# Patient Record
Sex: Male | Born: 1960 | Race: White | Hispanic: No | Marital: Married | Smoking: Former smoker
Health system: Southern US, Community
[De-identification: ages and names within clinical notes are randomized; demographics above are authoritative.]

## PROBLEM LIST (undated history)

## (undated) DIAGNOSIS — E079 Disorder of thyroid, unspecified: Secondary | ICD-10-CM

## (undated) DIAGNOSIS — I10 Essential (primary) hypertension: Secondary | ICD-10-CM

## (undated) DIAGNOSIS — M1990 Unspecified osteoarthritis, unspecified site: Secondary | ICD-10-CM

## (undated) DIAGNOSIS — I209 Angina pectoris, unspecified: Secondary | ICD-10-CM

## (undated) HISTORY — PX: FL INJ RT SHOULDER CT ARTHROGRAM (ARMC HX): HXRAD1304

## (undated) HISTORY — PX: OTHER SURGICAL HISTORY: SHX169

## (undated) HISTORY — PX: TIBIA FRACTURE SURGERY: SHX806

---

## 2004-11-19 ENCOUNTER — Emergency Department: Payer: Self-pay | Admitting: Unknown Physician Specialty

## 2005-06-13 ENCOUNTER — Emergency Department: Payer: Self-pay | Admitting: Emergency Medicine

## 2005-06-14 ENCOUNTER — Emergency Department: Payer: Self-pay | Admitting: Emergency Medicine

## 2006-01-15 ENCOUNTER — Other Ambulatory Visit: Payer: Self-pay

## 2006-01-15 ENCOUNTER — Emergency Department: Payer: Self-pay | Admitting: Emergency Medicine

## 2006-01-18 ENCOUNTER — Other Ambulatory Visit: Payer: Self-pay

## 2006-01-18 ENCOUNTER — Emergency Department: Payer: Self-pay | Admitting: Emergency Medicine

## 2006-01-29 ENCOUNTER — Emergency Department: Payer: Self-pay | Admitting: Emergency Medicine

## 2006-01-29 ENCOUNTER — Other Ambulatory Visit: Payer: Self-pay

## 2006-02-01 ENCOUNTER — Emergency Department: Payer: Self-pay | Admitting: General Practice

## 2006-05-18 ENCOUNTER — Emergency Department: Payer: Self-pay | Admitting: General Practice

## 2009-07-21 ENCOUNTER — Emergency Department: Payer: Self-pay | Admitting: Emergency Medicine

## 2009-08-14 ENCOUNTER — Ambulatory Visit: Payer: Self-pay | Admitting: Orthopedic Surgery

## 2009-08-26 ENCOUNTER — Encounter: Payer: Self-pay | Admitting: Orthopedic Surgery

## 2009-09-02 ENCOUNTER — Encounter: Payer: Self-pay | Admitting: Orthopedic Surgery

## 2009-09-21 ENCOUNTER — Ambulatory Visit: Payer: Self-pay | Admitting: Orthopedic Surgery

## 2009-10-03 ENCOUNTER — Encounter: Payer: Self-pay | Admitting: Orthopedic Surgery

## 2009-11-03 ENCOUNTER — Encounter: Payer: Self-pay | Admitting: Orthopedic Surgery

## 2009-12-01 ENCOUNTER — Encounter: Payer: Self-pay | Admitting: Orthopedic Surgery

## 2010-04-09 ENCOUNTER — Emergency Department: Payer: Self-pay | Admitting: Emergency Medicine

## 2010-04-12 ENCOUNTER — Emergency Department: Payer: Self-pay | Admitting: Emergency Medicine

## 2010-04-14 ENCOUNTER — Inpatient Hospital Stay: Payer: Self-pay | Admitting: Urology

## 2010-10-02 ENCOUNTER — Emergency Department: Payer: Self-pay | Admitting: Emergency Medicine

## 2011-02-09 ENCOUNTER — Emergency Department: Payer: Self-pay | Admitting: *Deleted
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
## 2011-08-26 ENCOUNTER — Emergency Department: Payer: Self-pay | Admitting: Emergency Medicine

## 2011-12-26 ENCOUNTER — Emergency Department: Payer: Self-pay | Admitting: Emergency Medicine

## 2011-12-26 LAB — CBC
HCT: 40.7 % (ref 40.0–52.0)
MCH: 31.3 pg (ref 26.0–34.0)
RBC: 4.59 10*6/uL (ref 4.40–5.90)
RDW: 12.9 % (ref 11.5–14.5)

## 2011-12-26 LAB — COMPREHENSIVE METABOLIC PANEL
Alkaline Phosphatase: 126 U/L (ref 50–136)
BUN: 17 mg/dL (ref 7–18)
Calcium, Total: 8.6 mg/dL (ref 8.5–10.1)
Co2: 25 mmol/L (ref 21–32)
Creatinine: 1.1 mg/dL (ref 0.60–1.30)
EGFR (African American): >60
Osmolality: 284 (ref 275–301)
SGOT(AST): 54 U/L — ABNORMAL HIGH (ref 15–37)
SGPT (ALT): 63 U/L
Total Protein: 7.1 g/dL (ref 6.4–8.2)

## 2011-12-26 LAB — TROPONIN I: Troponin-I: <0.02 ng/mL

## 2013-03-21 ENCOUNTER — Emergency Department: Payer: Self-pay | Admitting: Emergency Medicine

## 2013-03-22 ENCOUNTER — Emergency Department: Payer: Self-pay | Admitting: Emergency Medicine

## 2013-03-25 LAB — BODY FLUID CULTURE

## 2016-04-26 DIAGNOSIS — I208 Other forms of angina pectoris: Secondary | ICD-10-CM | POA: Diagnosis present

## 2016-05-04 ENCOUNTER — Encounter: Payer: Self-pay | Admitting: Certified Registered Nurse Anesthetist

## 2016-05-04 ENCOUNTER — Encounter
Admission: RE | Disposition: A | Discharge status: Home / Self Care | Payer: Self-pay | Source: Ambulatory Visit | Attending: Cardiology

## 2016-05-04 ENCOUNTER — Ambulatory Visit
Admission: RE | Admit: 2016-05-04 | Discharge: 2016-05-05 | Disposition: A | Discharge status: Home / Self Care | Payer: Medicaid Other | Source: Ambulatory Visit | Attending: Cardiology | Admitting: Cardiology

## 2016-05-04 ENCOUNTER — Encounter: Payer: Self-pay | Admitting: *Deleted

## 2016-05-04 DIAGNOSIS — Z9889 Other specified postprocedural states: Secondary | ICD-10-CM | POA: Insufficient documentation

## 2016-05-04 DIAGNOSIS — Z791 Long term (current) use of non-steroidal anti-inflammatories (NSAID): Secondary | ICD-10-CM | POA: Diagnosis not present

## 2016-05-04 DIAGNOSIS — Z7982 Long term (current) use of aspirin: Secondary | ICD-10-CM | POA: Diagnosis not present

## 2016-05-04 DIAGNOSIS — Z825 Family history of asthma and other chronic lower respiratory diseases: Secondary | ICD-10-CM | POA: Insufficient documentation

## 2016-05-04 DIAGNOSIS — Z8249 Family history of ischemic heart disease and other diseases of the circulatory system: Secondary | ICD-10-CM | POA: Diagnosis not present

## 2016-05-04 DIAGNOSIS — Z79899 Other long term (current) drug therapy: Secondary | ICD-10-CM | POA: Diagnosis not present

## 2016-05-04 DIAGNOSIS — Z79891 Long term (current) use of opiate analgesic: Secondary | ICD-10-CM | POA: Diagnosis not present

## 2016-05-04 DIAGNOSIS — R9430 Abnormal result of cardiovascular function study, unspecified: Secondary | ICD-10-CM | POA: Diagnosis present

## 2016-05-04 DIAGNOSIS — I208 Other forms of angina pectoris: Secondary | ICD-10-CM | POA: Diagnosis present

## 2016-05-04 DIAGNOSIS — I10 Essential (primary) hypertension: Secondary | ICD-10-CM | POA: Insufficient documentation

## 2016-05-04 DIAGNOSIS — E7800 Pure hypercholesterolemia, unspecified: Secondary | ICD-10-CM | POA: Insufficient documentation

## 2016-05-04 DIAGNOSIS — R079 Chest pain, unspecified: Secondary | ICD-10-CM | POA: Diagnosis present

## 2016-05-04 DIAGNOSIS — I25119 Atherosclerotic heart disease of native coronary artery with unspecified angina pectoris: Secondary | ICD-10-CM | POA: Diagnosis not present

## 2016-05-04 DIAGNOSIS — K219 Gastro-esophageal reflux disease without esophagitis: Secondary | ICD-10-CM | POA: Diagnosis not present

## 2016-05-04 DIAGNOSIS — Z87891 Personal history of nicotine dependence: Secondary | ICD-10-CM | POA: Insufficient documentation

## 2016-05-04 DIAGNOSIS — I2510 Atherosclerotic heart disease of native coronary artery without angina pectoris: Secondary | ICD-10-CM | POA: Diagnosis present

## 2016-05-04 DIAGNOSIS — I209 Angina pectoris, unspecified: Secondary | ICD-10-CM | POA: Diagnosis present

## 2016-05-04 DIAGNOSIS — E079 Disorder of thyroid, unspecified: Secondary | ICD-10-CM | POA: Diagnosis not present

## 2016-05-04 HISTORY — PX: CARDIAC CATHETERIZATION: SHX172

## 2016-05-04 HISTORY — DX: Angina pectoris, unspecified: I20.9

## 2016-05-04 HISTORY — DX: Unspecified osteoarthritis, unspecified site: M19.90

## 2016-05-04 HISTORY — DX: Essential (primary) hypertension: I10

## 2016-05-04 LAB — CREATININE, SERUM
CREATININE: 1.05 mg/dL (ref 0.61–1.24)
GFR calc Af Amer: >60 mL/min (ref >60)
GFR calc non Af Amer: >60 mL/min (ref >60)

## 2016-05-04 LAB — CBC
HCT: 38.8 % — ABNORMAL LOW (ref 40.0–52.0)
HEMOGLOBIN: 13.9 g/dL (ref 13.0–18.0)
MCH: 30.6 pg (ref 26.0–34.0)
MCHC: 35.8 g/dL (ref 32.0–36.0)
MCV: 85.5 fL (ref 80.0–100.0)
Platelets: 124 10*3/uL — ABNORMAL LOW (ref 150–440)
RBC: 4.54 MIL/uL (ref 4.40–5.90)
RDW: 12.9 % (ref 11.5–14.5)
WBC: 4.8 10*3/uL (ref 3.8–10.6)

## 2016-05-04 LAB — MRSA PCR SCREENING: MRSA by PCR: NEGATIVE

## 2016-05-04 SURGERY — LEFT HEART CATH AND CORONARY ANGIOGRAPHY
Anesthesia: Moderate Sedation

## 2016-05-04 MED ORDER — MIDAZOLAM HCL 2 MG/2ML IJ SOLN
INTRAMUSCULAR | Status: DC | PRN
  Administered 2016-05-04: 1 mg via INTRAVENOUS

## 2016-05-04 MED ORDER — SODIUM CHLORIDE 0.9 % WEIGHT BASED INFUSION: 1.0000 mL/kg/h | INTRAVENOUS | Status: DC

## 2016-05-04 MED ORDER — MORPHINE SULFATE (PF) 4 MG/ML IV SOLN
INTRAVENOUS | Status: DC | PRN
  Administered 2016-05-04 (×2): 2 mg via INTRAVENOUS

## 2016-05-04 MED ORDER — SODIUM CHLORIDE 0.9% FLUSH: 3.0000 mL | INTRAVENOUS | Status: DC | PRN

## 2016-05-04 MED ORDER — ASPIRIN EC 325 MG PO TBEC
325.0000 mg | DELAYED_RELEASE_TABLET | Freq: Every day | ORAL | Status: DC
  Administered 2016-05-05: 325 mg via ORAL
  Filled 2016-05-04: qty 1

## 2016-05-04 MED ORDER — SODIUM CHLORIDE 0.9 % WEIGHT BASED INFUSION: 3.0000 mL/kg/h | INTRAVENOUS | Status: DC

## 2016-05-04 MED ORDER — FENTANYL CITRATE (PF) 100 MCG/2ML IJ SOLN
INTRAMUSCULAR | Status: AC
  Filled 2016-05-04: qty 2

## 2016-05-04 MED ORDER — CLOPIDOGREL BISULFATE 75 MG PO TABS
75.0000 mg | ORAL_TABLET | Freq: Every day | ORAL | Status: DC
  Administered 2016-05-05: 75 mg via ORAL
  Filled 2016-05-04: qty 1

## 2016-05-04 MED ORDER — IOPAMIDOL (ISOVUE-300) INJECTION 61%
INTRAVENOUS | Status: DC | PRN
  Administered 2016-05-04: 150 mL via INTRA_ARTERIAL

## 2016-05-04 MED ORDER — FENTANYL CITRATE (PF) 100 MCG/2ML IJ SOLN
INTRAMUSCULAR | Status: DC | PRN
  Administered 2016-05-04: 25 ug via INTRAVENOUS

## 2016-05-04 MED ORDER — MORPHINE SULFATE (PF) 2 MG/ML IV SOLN: 2.0000 mg | INTRAVENOUS | Status: DC | PRN

## 2016-05-04 MED ORDER — METOPROLOL TARTRATE 25 MG PO TABS
25.0000 mg | ORAL_TABLET | Freq: Two times a day (BID) | ORAL | Status: DC
  Filled 2016-05-04 (×2): qty 1

## 2016-05-04 MED ORDER — MIDAZOLAM HCL 2 MG/2ML IJ SOLN
INTRAMUSCULAR | Status: DC | PRN
  Administered 2016-05-04 (×3): 1 mg via INTRAVENOUS

## 2016-05-04 MED ORDER — ASPIRIN 81 MG PO CHEW
CHEWABLE_TABLET | ORAL | Status: AC
  Filled 2016-05-04: qty 4

## 2016-05-04 MED ORDER — MIDAZOLAM HCL 2 MG/2ML IJ SOLN
INTRAMUSCULAR | Status: AC
  Filled 2016-05-04: qty 2

## 2016-05-04 MED ORDER — FENTANYL CITRATE (PF) 100 MCG/2ML IJ SOLN
INTRAMUSCULAR | Status: DC | PRN
  Administered 2016-05-04 (×3): 25 ug via INTRAVENOUS
  Administered 2016-05-04: 50 ug via INTRAVENOUS

## 2016-05-04 MED ORDER — MORPHINE SULFATE (PF) 4 MG/ML IV SOLN
INTRAVENOUS | Status: AC
  Filled 2016-05-04: qty 1

## 2016-05-04 MED ORDER — MORPHINE SULFATE ER 30 MG PO TBCR
30.0000 mg | EXTENDED_RELEASE_TABLET | Freq: Two times a day (BID) | ORAL | Status: DC
  Administered 2016-05-04 – 2016-05-05 (×2): 30 mg via ORAL
  Filled 2016-05-04 (×2): qty 1

## 2016-05-04 MED ORDER — SODIUM CHLORIDE 0.9 % IV SOLN
INTRAVENOUS | Status: DC | PRN
  Administered 2016-05-04: 250 mL via INTRAVENOUS

## 2016-05-04 MED ORDER — SODIUM CHLORIDE 0.9 % IV SOLN
INTRAVENOUS | Status: DC | PRN
  Administered 2016-05-04: 1.75 mg/kg/h via INTRAVENOUS

## 2016-05-04 MED ORDER — NITROGLYCERIN 5 MG/ML IV SOLN
INTRAVENOUS | Status: AC
  Filled 2016-05-04: qty 10

## 2016-05-04 MED ORDER — ONDANSETRON HCL 4 MG/2ML IJ SOLN: 4.0000 mg | Freq: Four times a day (QID) | INTRAMUSCULAR | Status: DC | PRN

## 2016-05-04 MED ORDER — CLOPIDOGREL BISULFATE 75 MG PO TABS
ORAL_TABLET | ORAL | Status: AC
  Filled 2016-05-04: qty 8

## 2016-05-04 MED ORDER — ISOSORBIDE MONONITRATE ER 30 MG PO TB24
30.0000 mg | ORAL_TABLET | Freq: Every day | ORAL | Status: DC
  Filled 2016-05-04: qty 1

## 2016-05-04 MED ORDER — ASPIRIN 81 MG PO CHEW: 81.0000 mg | CHEWABLE_TABLET | ORAL | Status: DC

## 2016-05-04 MED ORDER — OXYCODONE-ACETAMINOPHEN 5-325 MG PO TABS
ORAL_TABLET | ORAL | Status: AC
  Administered 2016-05-04: 2 via ORAL
  Filled 2016-05-04: qty 2

## 2016-05-04 MED ORDER — SODIUM CHLORIDE 0.9 % IV SOLN: 250.0000 mL | INTRAVENOUS | Status: DC | PRN

## 2016-05-04 MED ORDER — CLOPIDOGREL BISULFATE 75 MG PO TABS
ORAL_TABLET | ORAL | Status: DC | PRN
  Administered 2016-05-04: 600 mg via ORAL

## 2016-05-04 MED ORDER — SODIUM CHLORIDE 0.9 % WEIGHT BASED INFUSION: 3.0000 mL/kg/h | INTRAVENOUS | Status: AC

## 2016-05-04 MED ORDER — ASPIRIN 81 MG PO CHEW
CHEWABLE_TABLET | ORAL | Status: DC | PRN
  Administered 2016-05-04: 324 mg via ORAL

## 2016-05-04 MED ORDER — SODIUM CHLORIDE 0.9% FLUSH
3.0000 mL | Freq: Two times a day (BID) | INTRAVENOUS | Status: DC
  Administered 2016-05-04 – 2016-05-05 (×3): 3 mL via INTRAVENOUS

## 2016-05-04 MED ORDER — IOPAMIDOL (ISOVUE-300) INJECTION 61%
INTRAVENOUS | Status: DC | PRN
  Administered 2016-05-04: 130 mg via INTRA_ARTERIAL

## 2016-05-04 MED ORDER — HEPARIN SODIUM (PORCINE) 5000 UNIT/ML IJ SOLN
5000.0000 [IU] | Freq: Three times a day (TID) | INTRAMUSCULAR | Status: DC
  Filled 2016-05-04: qty 1

## 2016-05-04 MED ORDER — LEVOTHYROXINE SODIUM 75 MCG PO TABS
75.0000 ug | ORAL_TABLET | Freq: Every day | ORAL | Status: DC
  Administered 2016-05-05: 75 ug via ORAL
  Filled 2016-05-04 (×2): qty 1

## 2016-05-04 MED ORDER — SODIUM CHLORIDE 0.9% FLUSH: 3.0000 mL | Freq: Two times a day (BID) | INTRAVENOUS | Status: DC

## 2016-05-04 MED ORDER — BIVALIRUDIN 250 MG IV SOLR
INTRAVENOUS | Status: AC
  Filled 2016-05-04: qty 250

## 2016-05-04 MED ORDER — HEPARIN (PORCINE) IN NACL 2-0.9 UNIT/ML-% IJ SOLN
INTRAMUSCULAR | Status: AC
  Filled 2016-05-04: qty 500

## 2016-05-04 MED ORDER — ACETAMINOPHEN 325 MG PO TABS: 650.0000 mg | ORAL_TABLET | ORAL | Status: DC | PRN

## 2016-05-04 MED ORDER — MELOXICAM 7.5 MG PO TABS
15.0000 mg | ORAL_TABLET | Freq: Every day | ORAL | Status: DC
  Administered 2016-05-05: 15 mg via ORAL
  Filled 2016-05-04 (×2): qty 2
  Filled 2016-05-04: qty 1

## 2016-05-04 MED ORDER — OXYCODONE-ACETAMINOPHEN 5-325 MG PO TABS
1.0000 | ORAL_TABLET | ORAL | Status: DC | PRN
Start: 2016-05-04 — End: 2016-05-05
  Administered 2016-05-04 – 2016-05-05 (×5): 2 via ORAL
  Filled 2016-05-04 (×4): qty 2

## 2016-05-04 MED ORDER — BIVALIRUDIN BOLUS VIA INFUSION - CUPID
INTRAVENOUS | Status: DC | PRN
  Administered 2016-05-04: 66.375 mg via INTRAVENOUS

## 2016-05-04 SURGICAL SUPPLY — 16 items
BALLN TREK RX 2.5X15 (BALLOONS) ×4
BALLOON TREK RX 2.5X15 (BALLOONS) ×2 IMPLANT
CATH INFINITI 5FR ANG PIGTAIL (CATHETERS) ×4 IMPLANT
CATH INFINITI 5FR JL4 (CATHETERS) ×4 IMPLANT
CATH INFINITI JR4 5F (CATHETERS) ×4 IMPLANT
CATH VISTA GUIDE 6FR XB3.5 (CATHETERS) ×4 IMPLANT
DEVICE CLOSURE MYNXGRIP 6/7F (Vascular Products) ×4 IMPLANT
DEVICE INFLAT 30 PLUS (MISCELLANEOUS) ×4 IMPLANT
KIT MANI 3VAL PERCEP (MISCELLANEOUS) ×4 IMPLANT
NEEDLE PERC 18GX7CM (NEEDLE) ×4 IMPLANT
PACK CARDIAC CATH (CUSTOM PROCEDURE TRAY) ×4 IMPLANT
SHEATH AVANTI 6FR X 11CM (SHEATH) ×4 IMPLANT
SHEATH PINNACLE 5F 10CM (SHEATH) ×4 IMPLANT
STENT XIENCE ALPINE RX 3.25X23 (Permanent Stent) ×4 IMPLANT
WIRE ASAHI PROWATER 180CM (WIRE) ×4 IMPLANT
WIRE EMERALD 3MM-J .035X150CM (WIRE) ×4 IMPLANT

## 2016-05-04 NOTE — Progress Notes (Signed)
Spoke with sara wall regarding precaution. Patient has history of mrsa. mrsa pca swab was done and came back negative. Per sara wall contact precautions can be discontinued

## 2016-05-04 NOTE — Progress Notes (Signed)
Patient has chronic right shoulder pain. C/O pain in that area. Given PO pain medication. Patient is resting quietly now.

## 2016-05-04 NOTE — Progress Notes (Signed)
Patient transported to floor by bed with RN. 2A nurse Crystal in room and inspected right groin with this RN.

## 2016-05-04 NOTE — Progress Notes (Signed)
Report called to [employer], RN on 2A. Patient assigned to room 260. Patient should be ready to transport around 1115.

## 2016-05-04 NOTE — Care Management (Signed)
Elective cardiac cath resulting in cardiac intervention.  At present is not on cost prohibitive anti platelet or cost prohibitive anticoagulant

## 2016-05-04 NOTE — Discharge Instructions (Signed)

## 2016-05-05 ENCOUNTER — Encounter: Payer: Self-pay | Admitting: Internal Medicine

## 2016-05-05 DIAGNOSIS — I25119 Atherosclerotic heart disease of native coronary artery with unspecified angina pectoris: Secondary | ICD-10-CM | POA: Diagnosis not present

## 2016-05-05 LAB — CBC
HEMATOCRIT: 40 % (ref 40.0–52.0)
Hemoglobin: 14.2 g/dL (ref 13.0–18.0)
MCH: 30.5 pg (ref 26.0–34.0)
MCHC: 35.5 g/dL (ref 32.0–36.0)
MCV: 85.8 fL (ref 80.0–100.0)
PLATELETS: 122 10*3/uL — AB (ref 150–440)
RBC: 4.66 MIL/uL (ref 4.40–5.90)
RDW: 12.9 % (ref 11.5–14.5)
WBC: 5.4 10*3/uL (ref 3.8–10.6)

## 2016-05-05 LAB — BASIC METABOLIC PANEL
Anion gap: 6 (ref 5–15)
BUN: 19 mg/dL (ref 6–20)
CHLORIDE: 106 mmol/L (ref 101–111)
CO2: 26 mmol/L (ref 22–32)
CREATININE: 0.95 mg/dL (ref 0.61–1.24)
Calcium: 8.9 mg/dL (ref 8.9–10.3)
GFR calc Af Amer: >60 mL/min (ref >60)
GFR calc non Af Amer: >60 mL/min (ref >60)
GLUCOSE: 104 mg/dL — AB (ref 65–99)
POTASSIUM: 4.2 mmol/L (ref 3.5–5.1)
SODIUM: 138 mmol/L (ref 135–145)

## 2016-05-05 MED ORDER — ATORVASTATIN CALCIUM 80 MG PO TABS: 80.0000 mg | ORAL_TABLET | Freq: Every day | ORAL | 4 refills | Status: AC

## 2016-05-05 MED ORDER — CLOPIDOGREL BISULFATE 75 MG PO TABS: 75.0000 mg | ORAL_TABLET | Freq: Every day | ORAL | 4 refills | Status: AC

## 2016-05-05 MED ORDER — ASPIRIN 325 MG PO TBEC: 325.0000 mg | DELAYED_RELEASE_TABLET | Freq: Every day | ORAL | 0 refills | Status: AC

## 2016-05-05 NOTE — Care Management (Signed)
Is not discharging home on cost prohibitive antiplatelet medication.  there are no discharge needs.

## 2016-05-05 NOTE — Progress Notes (Signed)
Discharge instructions along with home medication list and follow up gone over with patient. Patient made aware prescriptions were electronically submitted to pharmacy. Patient verbalized that he understood instructions. Right groin site clean and dry. No bleeding no hematoma noted, soft to touch. Patient waiting on ride, will remove iv and telemetry once ride is available.

## 2016-05-05 NOTE — Discharge Summary (Signed)
[facility] Cardiology Discharge Summary  Patient ID: JACQUISE HALPIN MRN: [id number] DOB/AGE: 1961/04/24 55 y.o.  Admit date: 05/04/2016 Discharge date: 05/05/2016  Primary Discharge Diagnosis: Ischemic chest pain I20.9 Secondary Discharge Diagnosis high cholesterol and smoking  Significant Diagnostic Studies: Cardiac cath with left ventricular angiogram and selective coronary injection as well as PCI and stent placement of left anterior descending.  Hospital Course: The patient was admitted to specials for cardiac cath with selective coronary angiogram after full consent, risk and benefits explained, and time out called with all approprate details voiced and discussed. The patient has had progressive canadian class 4 angina with high probability risk stress test consistent with ischemic chest pain and or anginal equivalent with coronary artery risk factors including high blood pressure, high cholesterol and smoking. The procedure was performed without complication and it revealed normal left ventricular function with ejection fraction of 60%.  It was found that the patient had severe 1 vessel coronary atherosclerosis with significant left anterior descending stenosis requiring further intervention. Therefore, the patient had a PCI and drug eluding stent placed without complication. The patient has been ambulating without further significant symptoms and has reached his maximal hospital benefit and will be discharged to home in good condition.  Cardiac rehabilitation has been discussed and recommended. Medication management of cardiovascular risk factors will be given post discharge and modified as an outpatient.   Discharge Exam: Blood pressure 129/73, pulse (!) 50, temperature 98.2 F (36.8 C), temperature source Oral, resp. rate 20, height 5\' 9"  (1.753 m), weight 195 lb (88.5 kg), SpO2 96 %.  Constitutional: Alet oriented to person, place, and time. No distress.  HENT: No nasal  discharge.  Head: Normocephalic and atraumatic.  Eyes: Pupils are equal and round. No discharge.  Neck: Normal range of motion. Neck supple. No JVD present. No thyromegaly present.  Cardiovascular: Normal rate, regular rhythm, normal S1 S2, no gallop, no friction rub. No murmur Pulmonary/Chest: Effort normal, No stridor. No respiratory distress. no wheezes.  no rales.    Abdominal: Soft. Bowel sounds are normal.  no distension.  no tenderness. There is no rebound and no guarding.  Musculoskeletal: No edema, no cyanosis, normal pulses, no bleeding, Normal range of motion. no tenderness.  Neurological:  alert and oriented to person, place, and time. Coordination normal.  Skin: Skin is warm and dry. No rash noted. No erythema. No pallor.  Psychiatric:  normal mood and affect. behavior is normal.    Labs:   Lab Results  Component Value Date   WBC 5.4 05/05/2016   HGB 14.2 05/05/2016   HCT 40.0 05/05/2016   MCV 85.8 05/05/2016   PLT 122 (L) 05/05/2016    Recent Labs Lab 05/05/16 0355  NA 138  K 4.2  CL 106  CO2 26  BUN 19  CREATININE 0.95  CALCIUM 8.9  GLUCOSE 104*    EKG: NSR without evidence of new changes  FOLLOW UP IN ONE TO TWO WEEKS    Medication List    STOP taking these medications   ibuprofen 200 MG tablet Commonly known as:  ADVIL,MOTRIN   meloxicam 15 MG tablet Commonly known as:  MOBIC   morphine 30 MG 12 hr tablet Commonly known as:  MS CONTIN   nitroGLYCERIN 0.4 MG SL tablet Commonly known as:  NITROSTAT   Oxycodone HCl 20 MG Tabs     TAKE these medications   aspirin 325 MG EC tablet Take 1 tablet (325 mg total) by mouth daily.  atorvastatin 80 MG tablet Commonly known as:  LIPITOR Take 1 tablet (80 mg total) by mouth daily.   clopidogrel 75 MG tablet Commonly known as:  PLAVIX Take 1 tablet (75 mg total) by mouth daily with breakfast.   levothyroxine 75 MCG tablet Commonly known as:  SYNTHROID, LEVOTHROID Take 75 mcg by mouth daily  before breakfast. What changed:  Another medication with the same name was removed. Continue taking this medication, and follow the directions you see here.   metoprolol tartrate 25 MG tablet Commonly known as:  LOPRESSOR Take 25 mg by mouth 2 (two) times daily.        THE PATIENT  SHALL BRING ALL MEDICATIONS TO FOLLOW UP APPOINTMENT  Signed:  Lamar Blinks MD, [facility] 05/05/2016, 9:11 AM

## 2018-04-19 ENCOUNTER — Other Ambulatory Visit: Payer: Self-pay

## 2018-04-19 ENCOUNTER — Encounter: Payer: Self-pay | Admitting: Emergency Medicine

## 2018-04-19 ENCOUNTER — Emergency Department
Admission: EM | Admit: 2018-04-19 | Discharge: 2018-04-19 | Disposition: A | Discharge status: Home / Self Care | Payer: Medicaid Other | Attending: Emergency Medicine | Admitting: Emergency Medicine

## 2018-04-19 DIAGNOSIS — Z87891 Personal history of nicotine dependence: Secondary | ICD-10-CM | POA: Diagnosis not present

## 2018-04-19 DIAGNOSIS — I259 Chronic ischemic heart disease, unspecified: Secondary | ICD-10-CM | POA: Insufficient documentation

## 2018-04-19 DIAGNOSIS — I10 Essential (primary) hypertension: Secondary | ICD-10-CM | POA: Insufficient documentation

## 2018-04-19 DIAGNOSIS — E860 Dehydration: Secondary | ICD-10-CM | POA: Diagnosis present

## 2018-04-19 DIAGNOSIS — X30XXXA Exposure to excessive natural heat, initial encounter: Secondary | ICD-10-CM | POA: Insufficient documentation

## 2018-04-19 DIAGNOSIS — N289 Disorder of kidney and ureter, unspecified: Secondary | ICD-10-CM | POA: Diagnosis not present

## 2018-04-19 HISTORY — DX: Disorder of thyroid, unspecified: E07.9

## 2018-04-19 LAB — BASIC METABOLIC PANEL
Anion gap: 8 (ref 5–15)
BUN: 24 mg/dL — ABNORMAL HIGH (ref 6–20)
CALCIUM: 8.5 mg/dL — AB (ref 8.9–10.3)
CHLORIDE: 106 mmol/L (ref 98–111)
CO2: 24 mmol/L (ref 22–32)
Creatinine, Ser: 1.75 mg/dL — ABNORMAL HIGH (ref 0.61–1.24)
GFR calc non Af Amer: 41 mL/min — ABNORMAL LOW (ref >60)
GFR, EST AFRICAN AMERICAN: 48 mL/min — AB (ref >60)
GLUCOSE: 76 mg/dL (ref 70–99)
Potassium: 4.2 mmol/L (ref 3.5–5.1)
Sodium: 138 mmol/L (ref 135–145)

## 2018-04-19 LAB — URINALYSIS, COMPLETE (UACMP) WITH MICROSCOPIC
Bilirubin Urine: NEGATIVE
GLUCOSE, UA: NEGATIVE mg/dL
HGB URINE DIPSTICK: NEGATIVE
KETONES UR: NEGATIVE mg/dL
LEUKOCYTES UA: NEGATIVE
NITRITE: NEGATIVE
PH: 5 (ref 5.0–8.0)
PROTEIN: 30 mg/dL — AB
Specific Gravity, Urine: 1.025 (ref 1.005–1.030)

## 2018-04-19 LAB — CBC WITH DIFFERENTIAL/PLATELET
Basophils Absolute: 0 10*3/uL (ref 0–0.1)
Basophils Relative: 0 %
EOS ABS: 0.1 10*3/uL (ref 0–0.7)
EOS PCT: 1 %
HCT: 37 % — ABNORMAL LOW (ref 40.0–52.0)
Hemoglobin: 13 g/dL (ref 13.0–18.0)
LYMPHS ABS: 1 10*3/uL (ref 1.0–3.6)
Lymphocytes Relative: 14 %
MCH: 30.9 pg (ref 26.0–34.0)
MCHC: 35.3 g/dL (ref 32.0–36.0)
MCV: 87.7 fL (ref 80.0–100.0)
MONO ABS: 0.4 10*3/uL (ref 0.2–1.0)
MONOS PCT: 6 %
Neutro Abs: 5.6 10*3/uL (ref 1.4–6.5)
Neutrophils Relative %: 79 %
PLATELETS: 144 10*3/uL — AB (ref 150–440)
RBC: 4.22 MIL/uL — AB (ref 4.40–5.90)
RDW: 12.9 % (ref 11.5–14.5)
WBC: 7.1 10*3/uL (ref 3.8–10.6)

## 2018-04-19 LAB — COMPREHENSIVE METABOLIC PANEL
ALBUMIN: 4 g/dL (ref 3.5–5.0)
ALK PHOS: 83 U/L (ref 38–126)
ALT: 21 U/L (ref 0–44)
ANION GAP: 10 (ref 5–15)
AST: 32 U/L (ref 15–41)
BUN: 25 mg/dL — ABNORMAL HIGH (ref 6–20)
CHLORIDE: 105 mmol/L (ref 98–111)
CO2: 23 mmol/L (ref 22–32)
Calcium: 8.7 mg/dL — ABNORMAL LOW (ref 8.9–10.3)
Creatinine, Ser: 2.22 mg/dL — ABNORMAL HIGH (ref 0.61–1.24)
GFR calc non Af Amer: 31 mL/min — ABNORMAL LOW (ref >60)
GFR, EST AFRICAN AMERICAN: 36 mL/min — AB (ref >60)
GLUCOSE: 126 mg/dL — AB (ref 70–99)
POTASSIUM: 3.5 mmol/L (ref 3.5–5.1)
SODIUM: 138 mmol/L (ref 135–145)
Total Bilirubin: 1.1 mg/dL (ref 0.3–1.2)
Total Protein: 7.1 g/dL (ref 6.5–8.1)

## 2018-04-19 LAB — TROPONIN I: Troponin I: <0.03 ng/mL (ref <0.03)

## 2018-04-19 LAB — CK: Total CK: 314 U/L (ref 49–397)

## 2018-04-19 MED ORDER — SODIUM CHLORIDE 0.9 % IV SOLN
1000.0000 mL | Freq: Once | INTRAVENOUS | Status: AC
  Administered 2018-04-19: 1000 mL via INTRAVENOUS

## 2018-04-19 MED ORDER — SODIUM CHLORIDE 0.9 % IV SOLN
Freq: Once | INTRAVENOUS | Status: AC
  Administered 2018-04-19: 19:00:00 via INTRAVENOUS
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
## 2021-03-17 ENCOUNTER — Emergency Department
Admission: EM | Admit: 2021-03-17 | Discharge: 2021-03-17 | Disposition: A | Discharge status: Home / Self Care | Payer: Medicaid Other | Attending: Emergency Medicine | Admitting: Emergency Medicine

## 2021-03-17 DIAGNOSIS — X30XXXA Exposure to excessive natural heat, initial encounter: Secondary | ICD-10-CM | POA: Diagnosis not present

## 2021-03-17 DIAGNOSIS — Z5321 Procedure and treatment not carried out due to patient leaving prior to being seen by health care provider: Secondary | ICD-10-CM | POA: Insufficient documentation

## 2021-03-17 DIAGNOSIS — T675XXA Heat exhaustion, unspecified, initial encounter: Secondary | ICD-10-CM | POA: Diagnosis not present

## 2021-03-17 NOTE — ED Triage Notes (Signed)
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 Pt had been working out side today and became over heated, cramping all over , not eaten today.   97 162/99 99%RA [id number] NS LR given PTA

## 2021-03-17 NOTE — ED Notes (Signed)
IV removed.

## 2021-03-17 NOTE — ED Notes (Signed)
Pt states he is feeling better since having fluids and wants to go home . Pt has family here to transport him home

## 2022-01-19 ENCOUNTER — Observation Stay
Admission: EM | Admit: 2022-01-19 | Discharge: 2022-01-20 | Discharge status: Against Medical Advice | Payer: Medicaid Other | Attending: Internal Medicine | Admitting: Internal Medicine

## 2022-01-19 ENCOUNTER — Observation Stay: Payer: Medicaid Other

## 2022-01-19 ENCOUNTER — Emergency Department: Payer: Medicaid Other

## 2022-01-19 ENCOUNTER — Other Ambulatory Visit: Payer: Self-pay

## 2022-01-19 ENCOUNTER — Encounter: Payer: Self-pay | Admitting: Emergency Medicine

## 2022-01-19 DIAGNOSIS — I10 Essential (primary) hypertension: Secondary | ICD-10-CM | POA: Diagnosis not present

## 2022-01-19 DIAGNOSIS — Z7982 Long term (current) use of aspirin: Secondary | ICD-10-CM | POA: Insufficient documentation

## 2022-01-19 DIAGNOSIS — G4700 Insomnia, unspecified: Secondary | ICD-10-CM | POA: Diagnosis present

## 2022-01-19 DIAGNOSIS — G3183 Dementia with Lewy bodies: Secondary | ICD-10-CM | POA: Insufficient documentation

## 2022-01-19 DIAGNOSIS — Z87891 Personal history of nicotine dependence: Secondary | ICD-10-CM | POA: Diagnosis not present

## 2022-01-19 DIAGNOSIS — R55 Syncope and collapse: Secondary | ICD-10-CM | POA: Diagnosis present

## 2022-01-19 DIAGNOSIS — R197 Diarrhea, unspecified: Secondary | ICD-10-CM | POA: Diagnosis not present

## 2022-01-19 DIAGNOSIS — Z79899 Other long term (current) drug therapy: Secondary | ICD-10-CM | POA: Insufficient documentation

## 2022-01-19 DIAGNOSIS — E039 Hypothyroidism, unspecified: Secondary | ICD-10-CM | POA: Diagnosis present

## 2022-01-19 DIAGNOSIS — I2510 Atherosclerotic heart disease of native coronary artery without angina pectoris: Secondary | ICD-10-CM | POA: Diagnosis not present

## 2022-01-19 DIAGNOSIS — Z20822 Contact with and (suspected) exposure to covid-19: Secondary | ICD-10-CM | POA: Insufficient documentation

## 2022-01-19 DIAGNOSIS — F0280 Dementia in other diseases classified elsewhere without behavioral disturbance: Secondary | ICD-10-CM | POA: Diagnosis present

## 2022-01-19 LAB — HEPATIC FUNCTION PANEL
ALT: 33 U/L (ref 0–44)
AST: 44 U/L — ABNORMAL HIGH (ref 15–41)
Albumin: 4.3 g/dL (ref 3.5–5.0)
Alkaline Phosphatase: 84 U/L (ref 38–126)
Bilirubin, Direct: 0.2 mg/dL (ref 0.0–0.2)
Indirect Bilirubin: 0.9 mg/dL (ref 0.3–0.9)
Total Bilirubin: 1.1 mg/dL (ref 0.3–1.2)
Total Protein: 8.2 g/dL — ABNORMAL HIGH (ref 6.5–8.1)

## 2022-01-19 LAB — URINALYSIS, ROUTINE W REFLEX MICROSCOPIC
Bacteria, UA: NONE SEEN
Bilirubin Urine: NEGATIVE
Glucose, UA: NEGATIVE mg/dL
Ketones, ur: NEGATIVE mg/dL
Leukocytes,Ua: NEGATIVE
Nitrite: NEGATIVE
Protein, ur: NEGATIVE mg/dL
Specific Gravity, Urine: 1.023 (ref 1.005–1.030)
Squamous Epithelial / LPF: NONE SEEN (ref 0–5)
pH: 7 (ref 5.0–8.0)

## 2022-01-19 LAB — BASIC METABOLIC PANEL
Anion gap: 8 (ref 5–15)
BUN: 20 mg/dL (ref 8–23)
CO2: 25 mmol/L (ref 22–32)
Calcium: 9.6 mg/dL (ref 8.9–10.3)
Chloride: 105 mmol/L (ref 98–111)
Creatinine, Ser: 0.99 mg/dL (ref 0.61–1.24)
GFR, Estimated: >60 mL/min (ref >60)
Glucose, Bld: 132 mg/dL — ABNORMAL HIGH (ref 70–99)
Potassium: 4.1 mmol/L (ref 3.5–5.1)
Sodium: 138 mmol/L (ref 135–145)

## 2022-01-19 LAB — CBC
HCT: 43.1 % (ref 39.0–52.0)
Hemoglobin: 14.4 g/dL (ref 13.0–17.0)
MCH: 28.9 pg (ref 26.0–34.0)
MCHC: 33.4 g/dL (ref 30.0–36.0)
MCV: 86.4 fL (ref 80.0–100.0)
Platelets: 173 10*3/uL (ref 150–400)
RBC: 4.99 MIL/uL (ref 4.22–5.81)
RDW: 12.4 % (ref 11.5–15.5)
WBC: 5.9 10*3/uL (ref 4.0–10.5)
nRBC: 0 % (ref 0.0–0.2)

## 2022-01-19 LAB — BLOOD GAS, VENOUS
Acid-Base Excess: 2.5 mmol/L — ABNORMAL HIGH (ref 0.0–2.0)
Bicarbonate: 27.9 mmol/L (ref 20.0–28.0)
O2 Saturation: 94.7 %
Patient temperature: 37
pCO2, Ven: 45 mmHg (ref 44–60)
pH, Ven: 7.4 (ref 7.25–7.43)
pO2, Ven: 69 mmHg — ABNORMAL HIGH (ref 32–45)

## 2022-01-19 LAB — RESP PANEL BY RT-PCR (FLU A&B, COVID) ARPGX2
Influenza A by PCR: NEGATIVE
Influenza B by PCR: NEGATIVE
SARS Coronavirus 2 by RT PCR: NEGATIVE

## 2022-01-19 LAB — TROPONIN I (HIGH SENSITIVITY)
Troponin I (High Sensitivity): 5 ng/L (ref <18)
Troponin I (High Sensitivity): 5 ng/L (ref <18)

## 2022-01-19 LAB — TSH: TSH: 0.756 u[IU]/mL (ref 0.350–4.500)

## 2022-01-19 LAB — D-DIMER, QUANTITATIVE: D-Dimer, Quant: 0.72 ug/mL-FEU — ABNORMAL HIGH (ref 0.00–0.50)

## 2022-01-19 LAB — PROCALCITONIN: Procalcitonin: <0.1 ng/mL

## 2022-01-19 MED ORDER — TRAZODONE HCL 50 MG PO TABS: 50.0000 mg | ORAL_TABLET | Freq: Every evening | ORAL | Status: DC | PRN

## 2022-01-19 MED ORDER — ONDANSETRON HCL 4 MG PO TABS: 4.0000 mg | ORAL_TABLET | Freq: Four times a day (QID) | ORAL | Status: DC | PRN

## 2022-01-19 MED ORDER — ASPIRIN EC 325 MG PO TBEC: 325.0000 mg | DELAYED_RELEASE_TABLET | Freq: Every day | ORAL | Status: DC

## 2022-01-19 MED ORDER — ONDANSETRON HCL 4 MG/2ML IJ SOLN: 4.0000 mg | Freq: Four times a day (QID) | INTRAMUSCULAR | Status: DC | PRN

## 2022-01-19 MED ORDER — METOPROLOL TARTRATE 25 MG PO TABS: 25.0000 mg | ORAL_TABLET | Freq: Two times a day (BID) | ORAL | Status: DC

## 2022-01-19 MED ORDER — CLOPIDOGREL BISULFATE 75 MG PO TABS: 75.0000 mg | ORAL_TABLET | Freq: Every day | ORAL | Status: DC

## 2022-01-19 MED ORDER — MELATONIN 5 MG PO TABS
5.0000 mg | ORAL_TABLET | Freq: Every day | ORAL | Status: DC
  Administered 2022-01-19: 5 mg via ORAL
  Filled 2022-01-19: qty 1

## 2022-01-19 MED ORDER — POLYETHYLENE GLYCOL 3350 17 G PO PACK: 17.0000 g | PACK | Freq: Every day | ORAL | Status: DC | PRN

## 2022-01-19 MED ORDER — ENOXAPARIN SODIUM 40 MG/0.4ML IJ SOSY: 40.0000 mg | PREFILLED_SYRINGE | Freq: Every day | INTRAMUSCULAR | Status: DC

## 2022-01-19 MED ORDER — LORAZEPAM 2 MG/ML IJ SOLN
1.0000 mg | Freq: Once | INTRAMUSCULAR | Status: AC
Start: 2022-01-19 — End: 2022-01-19
  Administered 2022-01-19: 1 mg via INTRAVENOUS
  Filled 2022-01-19: qty 1

## 2022-01-19 MED ORDER — ACETAMINOPHEN 650 MG RE SUPP: 650.0000 mg | Freq: Four times a day (QID) | RECTAL | Status: DC | PRN

## 2022-01-19 MED ORDER — SODIUM CHLORIDE 0.9% FLUSH
3.0000 mL | Freq: Two times a day (BID) | INTRAVENOUS | Status: DC
  Administered 2022-01-19: 3 mL via INTRAVENOUS

## 2022-01-19 MED ORDER — LEVOTHYROXINE SODIUM 50 MCG PO TABS: 75.0000 ug | ORAL_TABLET | Freq: Every day | ORAL | Status: DC

## 2022-01-19 MED ORDER — IOHEXOL 350 MG/ML SOLN
100.0000 mL | Freq: Once | INTRAVENOUS | Status: AC | PRN
  Administered 2022-01-19: 100 mL via INTRAVENOUS

## 2022-01-19 MED ORDER — ATORVASTATIN CALCIUM 20 MG PO TABS: 80.0000 mg | ORAL_TABLET | Freq: Every day | ORAL | Status: DC

## 2022-01-19 MED ORDER — ACETAMINOPHEN 325 MG PO TABS
650.0000 mg | ORAL_TABLET | Freq: Four times a day (QID) | ORAL | Status: DC | PRN
Start: 2022-01-19 — End: 2022-01-20

## 2022-01-19 NOTE — ED Notes (Addendum)
Dr. Cox at the bedside 

## 2022-01-19 NOTE — Assessment & Plan Note (Addendum)
-   Etiology work-up in progress ?- Initial high sensitive troponin was reassuring for negative for ischemia/ACS ?- Check TSH given patient has dementia, possibility of noncompliance cannot be excluded at this time ?- Check B12, complete echo, D-dimer ?- If D-dimer is elevated we will order bilateral lower extremity ultrasound to assess for DVT and CTA of the chest to assess for PE ?- If the above work-up is negative would recommend a.m. team to consider consultation to neurology for syncope in patient with diagnosis of Lewy body dementia ?

## 2022-01-19 NOTE — Hospital Course (Signed)
Greg Carter is a 61 year old hypothyroid, Lewy body dementia, hyperlipidemia, history of ischemic chest pain, coronary artery disease, who presents emergency department for chief concerns of multiple syncopal events. ? ?Initial vitals in the emergency department showed temperature of 98.3, respiration rate of 17, heart rate 74, blood pressure 133/92, SPO2 of 93% on room air. ? ?Serum sodium 138, potassium 4.1, chloride 105, bicarb 25, BUN of 20, serum creatinine of 0.99, GFR greater than 60, nonfasting blood glucose 132, WBC 5.9, hemoglobin 14.4, platelets of 173. ? ?High sensitive troponin was 5. ? ?COVID/influenza A/influenza B PCR pending. ? ?Liver function tests showed AST of 44, ALT 3 3, T. bili is 1.1, and direct bili of 0.2. ? ?ED Treatment: Lorazepam 1 mg IV. ?

## 2022-01-19 NOTE — H&P (Signed)
?History and Physical  ? ?Jeshua Ransford Sivak IHK:[id number] DOB: 03/27/1961 DOA: 01/19/2022 ? ?PCP: [facility]  ?Outpatient Specialists: [facility] ?Patient coming from: home via POV ? ?I have personally briefly reviewed patient's old medical records in [facility] EMR. ? ?Chief Concern: Syncopal event ? ?HPI: Mr. Luccas Towell is a 61 year old hypothyroid, Lewy body dementia, hyperlipidemia, history of ischemic chest pain, coronary artery disease, who presents emergency department for chief concerns of multiple syncopal events. ? ?Initial vitals in the emergency department showed temperature of 98.3, respiration rate of 17, heart rate 74, blood pressure 133/92, SPO2 of 93% on room air. ? ?Serum sodium 138, potassium 4.1, chloride 105, bicarb 25, BUN of 20, serum creatinine of 0.99, GFR greater than 60, nonfasting blood glucose 132, WBC 5.9, hemoglobin 14.4, platelets of 173. ? ?High sensitive troponin was 5. ? ?COVID/influenza A/influenza B PCR pending. ? ?Liver function tests showed AST of 44, ALT 3 3, T. bili is 1.1, and direct bili of 0.2. ? ?ED Treatment: Lorazepam 1 mg IV. ? ?At bedside patient was able to tell me his name, age, current location of hospital.  He was able to identify his mother and spouse at bedside. ? ?Reports that he has had several episodes of syncope on day of admission.  He states that for the last 2 days he has been having weakness and body aches especially in his bilateral lower extremities. ? ?He endorses persistent shortness of breath especially with exertion.  He denies any known fever.  He also endorses 2 days of watery diarrhea, 9 episodes of watery brown stool.  He denies nausea, vomiting. ? ?Social history: He lives at home with his wife.  He denies history of tobacco, EtOH, recreational drug use.  He currently works as a Administrator.  I have extensively advised patient that he needs to refrain from operating heavy machinery's especially chainsaws  given his history of dementia.  Patient and spouse at bedside endorses understanding and compliance and states that he will avoid using heavy machinery from now on.  Patient further states that he has agreed to give up driving due to the dementia diagnosis. ? ?ROS: ?Constitutional: no weight change, no fever ?ENT/Mouth: no sore throat, no rhinorrhea ?Eyes: no eye pain, no vision changes ?Cardiovascular: no chest pain, + dyspnea,  no edema, no palpitations ?Respiratory: no cough, no sputum, no wheezing ?Gastrointestinal: no nausea, no vomiting, + diarrhea, no constipation ?Genitourinary: no urinary incontinence, no dysuria, no hematuria ?Musculoskeletal: no arthralgias, no myalgias ?Skin: no skin lesions, no pruritus, ?Neuro: + weakness, + loss of consciousness, + syncope ?Psych: no anxiety, no depression, + decrease appetite ?Heme/Lymph: no bruising, no bleeding ? ?ED Course: Discussed with emergency medicine provider, patient requiring hospitalization for chief concerns of syncope. ? ?Assessment/Plan ? ?Principal Problem: ?  Syncope ?Active Problems: ?  CAD (coronary artery disease) ?  Lewy body dementia (HCC) ?  Diarrhea ?  Insomnia ?  Hypothyroidism ?  ?Assessment and Plan: ? ?* Syncope ?- Etiology work-up in progress ?- Initial high sensitive troponin was reassuring for negative for ischemia/ACS ?- Check TSH given patient has dementia, possibility of noncompliance cannot be excluded at this time ?- Check B12, complete echo, D-dimer ?- If D-dimer is elevated we will order bilateral lower extremity ultrasound to assess for DVT and CTA of the chest to assess for PE ?- If the above work-up is negative would recommend a.m. team to consider consultation to neurology for syncope in patient with  diagnosis of Lewy body dementia ? ?Hypothyroidism ?- Resumed levothyroxine 75 mcg daily before breakfast ? ?Insomnia ?- Melatonin 5 mg nightly scheduled ?- Trazodone 50 mg nightly as needed for sleep, 1 dose  ordered ? ?Diarrhea ?- C. difficile screening and GI panel for diarrhea ordered ? ?Lewy body dementia (HCC) ?- Query progression ? ?CAD (coronary artery disease) ?- Resumed home aspirin 81 mg, Plavix, atorvastatin 80 mg daily ? ?AM team to complete med reconciliation ? ?Chart reviewed.  ? ?DVT prophylaxis: Enoxaparin ?Code Status: Full code ?Diet: Heart healthy ?Family Communication: Updated patient's spouse and mother at bedside ?Disposition Plan: Pending clinical course ?Consults called: None at this time ?Admission status: Telemetry medical, observation-if work-up is negative, would recommend a.m. team to consider neurology consultation ? ?Past Medical History:  ?Diagnosis Date  ? Anginal pain (HCC)   ? Arthritis   ? Hypertension   ? Thyroid disease   ? ?Past Surgical History:  ?Procedure Laterality Date  ? both knees    ? surgery  ? CARDIAC CATHETERIZATION Left 05/04/2016  ? Procedure: Left Heart Cath and Coronary Angiography;  Surgeon: Lamar BlinksBruce J Kowalski, MD;  Location: [facility] INVASIVE CV LAB;  Service: Cardiovascular;  Laterality: Left;  ? CARDIAC CATHETERIZATION N/A 05/04/2016  ? Procedure: Coronary Stent Intervention;  Surgeon: Marcina MillardAlexander Paraschos, MD;  Location: [facility] INVASIVE CV LAB;  Service: Cardiovascular;  Laterality: N/A;  ? FL INJ RT SHOULDER CT ARTHROGRAM ([facility] HX)    ? TIBIA FRACTURE SURGERY    ? ?Social History:  reports that he quit smoking about 31 years ago. He has never used smokeless tobacco. He reports that he does not drink alcohol and does not use drugs. ? ?No Known Allergies ?Family History  ?Problem Relation Age of Onset  ? Asthma Mother   ? Heart attack Father   ? ?Family history: Family history reviewed and not pertinent ? ?Prior to Admission medications   ?Medication Sig Start Date End Date Taking? Authorizing Provider  ?aspirin EC 325 MG EC tablet Take 1 tablet (325 mg total) by mouth daily. 05/05/16   Lamar BlinksKowalski, Bruce J, MD  ?atorvastatin (LIPITOR) 80 MG tablet Take 1 tablet (80 mg total) by  mouth daily. 05/05/16   Lamar BlinksKowalski, Bruce J, MD  ?clopidogrel (PLAVIX) 75 MG tablet Take 1 tablet (75 mg total) by mouth daily with breakfast. 05/05/16   Lamar BlinksKowalski, Bruce J, MD  ?levothyroxine (SYNTHROID, LEVOTHROID) 75 MCG tablet Take 75 mcg by mouth daily before breakfast.    [provider]  ?metoprolol tartrate (LOPRESSOR) 25 MG tablet Take 25 mg by mouth 2 (two) times daily.    [provider]  ? ?Physical Exam: ?Vitals:  ? 01/19/22 1806 01/19/22 1807 01/19/22 1907 01/19/22 2048  ?BP: (!) 149/89 (!) 136/110 (!) 130/92 138/89  ?Pulse: 67 61 63 (!) 59  ?Resp: 17 17 18 17   ?Temp:   98.5 ?F (36.9 ?C) 98.1 ?F (36.7 ?C)  ?TempSrc:   Oral Oral  ?SpO2: 98% 99% 98% 97%  ?Weight:    81.6 kg  ?Height:    5\' 9"  (1.753 m)  ? ?Constitutional: appears age-appropriate, NAD, calm, comfortable ?Eyes: PERRL, lids and conjunctivae normal ?ENMT: Mucous membranes are moist. Posterior pharynx clear of any exudate or lesions. Age-appropriate dentition. Hearing appropriate ?Neck: normal, supple, no masses, no thyromegaly ?Respiratory: clear to auscultation bilaterally, no wheezing, no crackles. Normal respiratory effort. No accessory muscle use.  ?Cardiovascular: Regular rate and rhythm, no murmurs / rubs / gallops. No extremity edema. 2+ pedal pulses. No  carotid bruits.  ?Abdomen: no tenderness, no masses palpated, no hepatosplenomegaly. Bowel sounds positive.  ?Musculoskeletal: no clubbing / cyanosis. No joint deformity upper and lower extremities. Good ROM, no contractures, no atrophy. Normal muscle tone.  ?Skin: no rashes, lesions, ulcers. No induration ?Neurologic: Sensation intact. Strength 5/5 in all 4.  ?Psychiatric: Normal judgment and insight. Alert and oriented x 3. Normal mood.  ? ?EKG: independently reviewed, showing sinus rhythm with rate of 74, QTc 424 ? ?Chest x-ray on Admission: I personally reviewed and I agree with radiologist reading as below. ? ?DG Chest 1 View ? ?Result Date: 01/19/2022 ?CLINICAL DATA:   Chest pain, syncope EXAM: CHEST  1 VIEW COMPARISON:  None. FINDINGS: The heart size and mediastinal contours are within normal limits. Both lungs are clear. The visualized skeletal structures are unremarkable. IMPRESSION:

## 2022-01-19 NOTE — Assessment & Plan Note (Signed)
-   Query progression ?

## 2022-01-19 NOTE — ED Provider Notes (Signed)
? ?[facility] ?Provider Note ? ? ? Event Date/Time  ? First MD Initiated Contact with Patient 01/19/22 1613   ?  (approximate) ? ? ?History  ? ?Loss of Consciousness and Altered Mental Status ? ? ?HPI ? ?Greg Carter is a 61 y.o. male here with multiple complaints.  The patient states that over the last week, he has had general fatigue, loss of balance, cough, and bilateral body aches.  Patient states that he has been coughing with yellow-green sputum production.  Denies any fevers.  Over the last several days, he has noticed significant loss of balance, slightly slurred speech, and occasional difficulty swallowing.  He has had increasing cough.  He is also had diffuse body aches.  Denies known sick contacts.  Today at work, he had 3-4 episodes in which he acutely lost consciousness.  Did not feel any prodrome or warning.  He was holding a chainsaw, but thankfully it was not on.  Denies any focal numbness or weakness.  No history of syncope.  No chest pain.  He feels generally weak.  Wife notes some slightly slurred speech as well.  No recent medication changes. ?  ? ? ?Physical Exam  ? ?Triage Vital Signs: ?ED Triage Vitals  ?Enc Vitals Group  ?   BP 01/19/22 1414 (!) 133/92  ?   Pulse Rate 01/19/22 1414 74  ?   Resp 01/19/22 1414 17  ?   Temp 01/19/22 1414 98.3 ?F (36.8 ?C)  ?   Temp Source 01/19/22 1414 Oral  ?   SpO2 01/19/22 1414 93 %  ?   Weight 01/19/22 1415 179 lb 14.3 oz (81.6 kg)  ?   Height 01/19/22 1415 5\' 9"  (1.753 m)  ?   Head Circumference --   ?   Peak Flow --   ?   Pain Score 01/19/22 1415 7  ?   Pain Loc --   ?   Pain Edu? --   ?   Excl. in GC? --   ? ? ?Most recent vital signs: ?Vitals:  ? 01/19/22 1907 01/19/22 2048  ?BP: (!) 130/92 138/89  ?Pulse: 63 (!) 59  ?Resp: 18 17  ?Temp: 98.5 ?F (36.9 ?C) 98.1 ?F (36.7 ?C)  ?SpO2: 98% 97%  ? ? ? ?General: Awake, no distress.  ?CV:  Good peripheral perfusion.  Regular rate.  No murmurs. ?Resp:  Normal effort.  Frequent cough.   Coarse rhonchi that clear with coughing. ?Abd:  No distention.  No tenderness. ?Other:  Cranial nerves II through XII intact, though speech is slightly slurred.  Mild left-sided dysmetria on finger-to-nose.  Strength out of 5 bilateral upper and lower extremity.  Normal sensation to light touch.  Gait deferred due to instability. ? ? ?ED Results / Procedures / Treatments  ? ?Labs ?(all labs ordered are listed, but only abnormal results are displayed) ?Labs Reviewed  ?BASIC METABOLIC PANEL - Abnormal; Notable for the following components:  ?    Result Value  ? Glucose, Bld 132 (*)   ? All other components within normal limits  ?URINALYSIS, ROUTINE W REFLEX MICROSCOPIC - Abnormal; Notable for the following components:  ? Color, Urine YELLOW (*)   ? APPearance CLEAR (*)   ? Hgb urine dipstick SMALL (*)   ? All other components within normal limits  ?BLOOD GAS, VENOUS - Abnormal; Notable for the following components:  ? pO2, Ven 69 (*)   ? Acid-Base Excess 2.5 (*)   ? All  other components within normal limits  ?HEPATIC FUNCTION PANEL - Abnormal; Notable for the following components:  ? Total Protein 8.2 (*)   ? AST 44 (*)   ? All other components within normal limits  ?D-DIMER, QUANTITATIVE - Abnormal; Notable for the following components:  ? D-Dimer, Quant 0.72 (*)   ? All other components within normal limits  ?RESP PANEL BY RT-PCR (FLU A&B, COVID) ARPGX2  ?CBC  ?TSH  ?PROCALCITONIN  ?CBG MONITORING, ED  ?TROPONIN I (HIGH SENSITIVITY)  ?TROPONIN I (HIGH SENSITIVITY)  ? ? ? ?EKG ?Normal sinus rhythm, ventricular rate 74.  PR 168, QRS 80, QTc 424.  No acute ST elevations or depressions.  No EKG evidence of acute ischemia or infarct. ? ? ?RADIOLOGY ?CT head: No acute intracranial abnormality. ?Chest x-ray: Ill-defined opacity right lung base ? ? ?I also independently reviewed and agree wit radiologist interpretations. ? ? ?PROCEDURES: ? ?Critical Care performed: No ? ?.1-3 Lead EKG Interpretation ?Performed by: Duffy Bruce, MD ?Authorized by: Duffy Bruce, MD  ? ?  Interpretation: normal   ?  ECG rate:  50-70 ?  ECG rate assessment: bradycardic   ?  Rhythm: sinus rhythm   ?  Ectopy: none   ?  Conduction: normal   ?Comments:  ?   Indication: syncope ? ? ? ?MEDICATIONS ORDERED IN ED: ?Medications  ?atorvastatin (LIPITOR) tablet 80 mg (80 mg Oral Patient Refused/Not Given 01/19/22 2126)  ?levothyroxine (SYNTHROID) tablet 75 mcg (has no administration in time range)  ?sodium chloride flush (NS) 0.9 % injection 3 mL (3 mLs Intravenous Given 01/19/22 2139)  ?enoxaparin (LOVENOX) injection 40 mg (40 mg Subcutaneous Patient Refused/Not Given 01/19/22 2127)  ?acetaminophen (TYLENOL) tablet 650 mg (has no administration in time range)  ?  Or  ?acetaminophen (TYLENOL) suppository 650 mg (has no administration in time range)  ?ondansetron (ZOFRAN) tablet 4 mg (has no administration in time range)  ?  Or  ?ondansetron (ZOFRAN) injection 4 mg (has no administration in time range)  ?polyethylene glycol (MIRALAX / GLYCOLAX) packet 17 g (has no administration in time range)  ?melatonin tablet 5 mg (5 mg Oral Given 01/19/22 2138)  ?traZODone (DESYREL) tablet 50 mg (has no administration in time range)  ?aspirin EC tablet 325 mg (has no administration in time range)  ?metoprolol tartrate (LOPRESSOR) tablet 25 mg (has no administration in time range)  ?clopidogrel (PLAVIX) tablet 75 mg (has no administration in time range)  ?LORazepam (ATIVAN) injection 1 mg (1 mg Intravenous Given 01/19/22 1815)  ?iohexol (OMNIPAQUE) 350 MG/ML injection 100 mL (100 mLs Intravenous Contrast Given 01/19/22 2306)  ? ? ? ?IMPRESSION / MDM / ASSESSMENT AND PLAN / ED COURSE  ?I reviewed the triage vital signs and the nursing notes. ?             ?               ? ? ?The patient is on the cardiac monitor to evaluate for evidence of arrhythmia and/or significant heart rate changes. ? ? ?MDM:  ?61 yo M with PMHx Lewy body dementia here with weakness, multiple episodes of  syncope. Re: weakness, DDx is broad. Pt does c/o increasing cough, sputum production raising ? Of CAP vs COPD, though CXR is not consistent with this. Lab work is overall unremarkable, with no leukocytosis, anemia. Normal trop and EKG is nonischemic. No ectopy noted on telemetry. UA negative.  ? ?Re: syncope - pt has had multiple, unprovoked episodes today with high  risk features. Will admit for observation. Re: weakness, this could be related to possible URI with his LBD, but DDx includes CVA as well - will check MRI. ? ? ?MEDICATIONS GIVEN IN ED: ?Medications  ?atorvastatin (LIPITOR) tablet 80 mg (80 mg Oral Patient Refused/Not Given 01/19/22 2126)  ?levothyroxine (SYNTHROID) tablet 75 mcg (has no administration in time range)  ?sodium chloride flush (NS) 0.9 % injection 3 mL (3 mLs Intravenous Given 01/19/22 2139)  ?enoxaparin (LOVENOX) injection 40 mg (40 mg Subcutaneous Patient Refused/Not Given 01/19/22 2127)  ?acetaminophen (TYLENOL) tablet 650 mg (has no administration in time range)  ?  Or  ?acetaminophen (TYLENOL) suppository 650 mg (has no administration in time range)  ?ondansetron (ZOFRAN) tablet 4 mg (has no administration in time range)  ?  Or  ?ondansetron (ZOFRAN) injection 4 mg (has no administration in time range)  ?polyethylene glycol (MIRALAX / GLYCOLAX) packet 17 g (has no administration in time range)  ?melatonin tablet 5 mg (5 mg Oral Given 01/19/22 2138)  ?traZODone (DESYREL) tablet 50 mg (has no administration in time range)  ?aspirin EC tablet 325 mg (has no administration in time range)  ?metoprolol tartrate (LOPRESSOR) tablet 25 mg (has no administration in time range)  ?clopidogrel (PLAVIX) tablet 75 mg (has no administration in time range)  ?LORazepam (ATIVAN) injection 1 mg (1 mg Intravenous Given 01/19/22 1815)  ?iohexol (OMNIPAQUE) 350 MG/ML injection 100 mL (100 mLs Intravenous Contrast Given 01/19/22 2306)  ? ? ? ?Consults:  ?Hospitalist consulted for admission ? ? ?EMR reviewed   ?Neurology office visit reviewed 09/2021 noting LBD with aggressive behavior ? ? ? ? ?FINAL CLINICAL IMPRESSION(S) / ED DIAGNOSES  ? ?Final diagnoses:  ?Syncope, unspecified syncope type  ? ? ? ?Rx / DC [place]

## 2022-01-19 NOTE — Assessment & Plan Note (Signed)
-   C. difficile screening and GI panel for diarrhea ordered ?

## 2022-01-19 NOTE — ED Notes (Signed)
Lab called to add trop and hepatic function onto already sent labs. ?

## 2022-01-19 NOTE — Assessment & Plan Note (Signed)
-   Melatonin 5 mg nightly scheduled ?- Trazodone 50 mg nightly as needed for sleep, 1 dose ordered ?

## 2022-01-19 NOTE — Assessment & Plan Note (Signed)
-   Resumed home aspirin 81 mg, Plavix, atorvastatin 80 mg daily ?

## 2022-01-19 NOTE — ED Notes (Addendum)
Pt presents to ED with wife with c/o of multiple syncopal episodes that started yesterday and have also happened today. 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 Pt states he has a cough and also c/o of both legs being weak. Pt has HX of Lewy body dementia. Pt denies any injury or pain at this time. Pt denies any CP or SOB to this RN but states his cough is productive with green sputum. Pt denies fevers or chills.  ? ?Pt does present with small lac to nose, pt denies blood thinners. Pt also mentions pain in her hands and feet, pt denies any diabetes.  ?

## 2022-01-19 NOTE — ED Triage Notes (Addendum)
Pt comes into the ED via POV c/o AMS and syncopal episodes.  Per the family, the patient has had 3-4 syncopal episodes today where he fell and hit his face.  Pt has small laceration over the bridge of his nose.  Pt denies any blood thinners.  PT does admit to pain generalized in his bones and well as in his head.  Pt currently able to answer questions appropriately.   ?

## 2022-01-19 NOTE — Assessment & Plan Note (Signed)
-   Resumed levothyroxine 75 mcg daily before breakfast ?

## 2022-01-20 DIAGNOSIS — Z5329 Procedure and treatment not carried out because of patient's decision for other reasons: Secondary | ICD-10-CM

## 2022-01-25 NOTE — Discharge Summary (Signed)
?Physician Discharge Summary ?  ?Patient: Greg Carter MRN: [id number] DOB: 24-Jan-1961  ?Admit date:     01/19/2022  ?Discharge date: 01/20/22  ?Discharge Physician: Kizer Nobbe N Ricard Faulkner  ? ?PCP: [facility]  ? ?Recommendations at discharge:  ?Patient left AGAINST MEDICAL ADVICE. ? ?Discharge Diagnoses: ?Principal Problem: ?  Syncope ?Active Problems: ?  CAD (coronary artery disease) ?  Lewy body dementia ([place]) ?  Diarrhea ?  Insomnia ?  Hypothyroidism ? ?Resolved Problems: ?  * No resolved hospital problems. * ? ?Hospital Course: ?Mr. Greg Carter is a 61 year old hypothyroid, Lewy body dementia, hyperlipidemia, history of ischemic chest pain, coronary artery disease, who presents emergency department for chief concerns of multiple syncopal events. ? ?Initial vitals in the emergency department showed temperature of 98.3, respiration rate of 17, heart rate 74, blood pressure 133/92, SPO2 of 93% on room air. ? ?Serum sodium 138, potassium 4.1, chloride 105, bicarb 25, BUN of 20, serum creatinine of 0.99, GFR greater than 60, nonfasting blood glucose 132, WBC 5.9, hemoglobin 14.4, platelets of 173. ? ?High sensitive troponin was 5. ? ?COVID/influenza A/influenza B PCR pending. ? ?Liver function tests showed AST of 44, ALT 3 3, T. bili is 1.1, and direct bili of 0.2. ? ?ED Treatment: Lorazepam 1 mg IV. ? ?Assessment and Plan: ?* Syncope ?- Etiology work-up in progress ?- Initial high sensitive troponin was reassuring for negative for ischemia/ACS ?- Check TSH given patient has dementia, possibility of noncompliance cannot be excluded at this time ?- Check B12, complete echo, D-dimer ?- If D-dimer is elevated we will order bilateral lower extremity ultrasound to assess for DVT and CTA of the chest to assess for PE ?- If the above work-up is negative would recommend a.m. team to consider consultation to neurology for syncope in patient with diagnosis of Lewy body dementia ? ?Hypothyroidism ?-  Resumed levothyroxine 75 mcg daily before breakfast ? ?Insomnia ?- Melatonin 5 mg nightly scheduled ?- Trazodone 50 mg nightly as needed for sleep, 1 dose ordered ? ?Diarrhea ?- C. difficile screening and GI panel for diarrhea ordered ? ?Lewy body dementia ([place]) ?- Query progression ? ?CAD (coronary artery disease) ?- Resumed home aspirin 81 mg, Plavix, atorvastatin 80 mg daily ? ? ?Patient left AGAINST MEDICAL ADVICE.  I called spouse and attempted to discuss the risk of leaving [place] to spouse, as patient has Lewy body dementia.  When I arrived to patient's room, patient has already left his room. ? ?No medications were discussed as patient left AGAINST MEDICAL ADVICE. ? ?Imaging Studies: ?DG Chest 1 View ? ?Addendum Date: 01/19/2022   ?ADDENDUM REPORT: 01/19/2022 23:19 ADDENDUM: Nipple markers are noted bilaterally. Electronically Signed   By: Fidela Salisbury M.D.   On: 01/19/2022 23:19  ? ?Result Date: 01/19/2022 ?CLINICAL DATA:  Chest pain, syncope EXAM: CHEST  1 VIEW COMPARISON:  None. FINDINGS: The heart size and mediastinal contours are within normal limits. Both lungs are clear. The visualized skeletal structures are unremarkable. IMPRESSION: No active disease. Electronically Signed: By: Fidela Salisbury M.D. On: 01/19/2022 19:46  ? ?DG Chest 2 View ? ?Result Date: 01/19/2022 ?CLINICAL DATA:  Syncopal episodes with cough and weakness. EXAM: CHEST - 2 VIEW COMPARISON:  December 26, 2011 FINDINGS: The heart size and mediastinal contours are within normal limits. A trace amount of linear atelectasis is seen within the lateral aspect of the mid right lung. An ill-defined opacity is seen overlying the right lung base which  may represent a nipple shadow. Both lungs are otherwise clear. The visualized skeletal structures are unremarkable. IMPRESSION: 1. Ill-defined opacity overlying the right lung base which may represent a nipple shadow. Correlation with repeat chest radiograph with nipple markers is  recommended. 2. No acute cardiopulmonary disease. Electronically Signed   By: Virgina Norfolk M.D.   On: 01/19/2022 17:32  ? ?CT HEAD WO CONTRAST ? ?Result Date: 01/19/2022 ?CLINICAL DATA:  Trauma EXAM: CT HEAD WITHOUT CONTRAST TECHNIQUE: Contiguous axial images were obtained from the base of the skull through the vertex without intravenous contrast. RADIATION DOSE REDUCTION: This exam was performed according to the departmental dose-optimization program which includes automated exposure control, adjustment of the mA and/or kV according to patient size and/or use of iterative reconstruction technique. COMPARISON:  Head CT dated July 21, 2009 FINDINGS: Brain: No evidence of acute infarction, hemorrhage, hydrocephalus, extra-axial collection or mass lesion/mass effect. Vascular: No hyperdense vessel or unexpected calcification. Skull: Normal. Negative for fracture or focal lesion. Sinuses/Orbits: No acute finding. Other: None. IMPRESSION: No acute intracranial abnormality. Electronically Signed   By: Yetta Glassman M.D.   On: 01/19/2022 14:40  ? ?CT Angio Chest Pulmonary Embolism (PE) W or WO Contrast ? ?Result Date: 01/19/2022 ?CLINICAL DATA:  Syncope. Positive D-dimer. Concern for pulmonary embolism. EXAM: CT ANGIOGRAPHY CHEST WITH CONTRAST TECHNIQUE: Multidetector CT imaging of the chest was performed using the standard protocol during bolus administration of intravenous contrast. Multiplanar CT image reconstructions and MIPs were obtained to evaluate the vascular anatomy. RADIATION DOSE REDUCTION: This exam was performed according to the departmental dose-optimization program which includes automated exposure control, adjustment of the mA and/or kV according to patient size and/or use of iterative reconstruction technique. CONTRAST:  178mL OMNIPAQUE IOHEXOL 350 MG/ML SOLN COMPARISON:  Chest radiograph dated 01/19/2022. FINDINGS: Evaluation of this exam is limited due to respiratory motion artifact.  Cardiovascular: There is no cardiomegaly or pericardial effusion. Mild coronary vascular calcification of the left coronary artery. The thoracic aorta is unremarkable. Evaluation of the pulmonary arteries is limited due to respiratory motion artifact. No pulmonary artery embolus identified. Mediastinum/Nodes: No hilar or mediastinal adenopathy. The esophagus is grossly unremarkable. No mediastinal fluid collection. Lungs/Pleura: No focal consolidation, pleural effusion, or pneumothorax. There is a 2 mm subpleural nodule in the right middle lobe (48/6). The central airways are patent. Upper Abdomen: Small left diaphragmatic defect with herniation of the abdominal fat. Musculoskeletal: No acute osseous pathology. Review of the MIP images confirms the above findings. IMPRESSION: 1. No acute intrathoracic pathology. No CT evidence of pulmonary artery embolus. 2. A 2 mm right middle lobe subpleural nodule. No follow-up needed if patient is low-risk.This recommendation follows the consensus statement: Guidelines for Management of Incidental Pulmonary Nodules Detected on CT Images: From the Fleischner Society 2017; Radiology 2017; 284:228-243. Electronically Signed   By: Anner Crete M.D.   On: 01/19/2022 23:33  ? ?MR BRAIN WO CONTRAST ? ?Result Date: 01/19/2022 ?CLINICAL DATA:  Multiple syncopal episodes EXAM: MRI HEAD WITHOUT CONTRAST TECHNIQUE: Multiplanar, multiecho pulse sequences of the brain and surrounding structures were obtained without intravenous contrast. COMPARISON:  CT head 01/19/2022 FINDINGS: Brain: No restricted diffusion to suggest acute or subacute infarct. No acute hemorrhage, mass, mass effect, or midline shift. No hydrocephalus or extra-axial collection. T2 hyperintense signal in the periventricular white matter, likely the sequela of chronic small vessel ischemic disease. Vascular: Normal flow voids. Skull and upper cervical spine: Normal marrow signal. Sinuses/Orbits: No acute finding. Other:  The mastoids are well aerated.  Numerous T2 hyperintense foci in the scalp, possibly inclusion cyst. IMPRESSION: No acute intracranial process. Electronically Signed   By: Merilyn Baba M.D.   On: 01/19/2022 19:08   ? ?Mi

## 2022-03-07 ENCOUNTER — Emergency Department
Admission: EM | Admit: 2022-03-07 | Discharge: 2022-03-07 | Disposition: A | Discharge status: Home / Self Care | Payer: Medicaid Other | Attending: Emergency Medicine | Admitting: Emergency Medicine

## 2022-03-07 ENCOUNTER — Emergency Department: Payer: Medicaid Other

## 2022-03-07 ENCOUNTER — Other Ambulatory Visit: Payer: Self-pay

## 2022-03-07 DIAGNOSIS — G3183 Dementia with Lewy bodies: Secondary | ICD-10-CM | POA: Diagnosis not present

## 2022-03-07 DIAGNOSIS — F0280 Dementia in other diseases classified elsewhere without behavioral disturbance: Secondary | ICD-10-CM | POA: Diagnosis not present

## 2022-03-07 DIAGNOSIS — I2510 Atherosclerotic heart disease of native coronary artery without angina pectoris: Secondary | ICD-10-CM | POA: Diagnosis not present

## 2022-03-07 DIAGNOSIS — R1013 Epigastric pain: Secondary | ICD-10-CM | POA: Diagnosis present

## 2022-03-07 DIAGNOSIS — K219 Gastro-esophageal reflux disease without esophagitis: Secondary | ICD-10-CM | POA: Diagnosis not present

## 2022-03-07 DIAGNOSIS — E039 Hypothyroidism, unspecified: Secondary | ICD-10-CM | POA: Insufficient documentation

## 2022-03-07 DIAGNOSIS — I10 Essential (primary) hypertension: Secondary | ICD-10-CM | POA: Insufficient documentation

## 2022-03-07 LAB — COMPREHENSIVE METABOLIC PANEL
ALT: 30 U/L (ref 0–44)
AST: 33 U/L (ref 15–41)
Albumin: 4 g/dL (ref 3.5–5.0)
Alkaline Phosphatase: 87 U/L (ref 38–126)
Anion gap: 9 (ref 5–15)
BUN: 23 mg/dL (ref 8–23)
CO2: 23 mmol/L (ref 22–32)
Calcium: 8.9 mg/dL (ref 8.9–10.3)
Chloride: 106 mmol/L (ref 98–111)
Creatinine, Ser: 1.15 mg/dL (ref 0.61–1.24)
GFR, Estimated: >60 mL/min (ref >60)
Glucose, Bld: 109 mg/dL — ABNORMAL HIGH (ref 70–99)
Potassium: 4 mmol/L (ref 3.5–5.1)
Sodium: 138 mmol/L (ref 135–145)
Total Bilirubin: 1.3 mg/dL — ABNORMAL HIGH (ref 0.3–1.2)
Total Protein: 7.9 g/dL (ref 6.5–8.1)

## 2022-03-07 LAB — URINALYSIS, ROUTINE W REFLEX MICROSCOPIC
Bacteria, UA: NONE SEEN
Bilirubin Urine: NEGATIVE
Glucose, UA: NEGATIVE mg/dL
Hgb urine dipstick: NEGATIVE
Ketones, ur: NEGATIVE mg/dL
Nitrite: NEGATIVE
Protein, ur: NEGATIVE mg/dL
Specific Gravity, Urine: 1.027 (ref 1.005–1.030)
pH: 5 (ref 5.0–8.0)

## 2022-03-07 LAB — CBC
HCT: 42 % (ref 39.0–52.0)
Hemoglobin: 14.3 g/dL (ref 13.0–17.0)
MCH: 29.2 pg (ref 26.0–34.0)
MCHC: 34 g/dL (ref 30.0–36.0)
MCV: 85.7 fL (ref 80.0–100.0)
Platelets: 159 10*3/uL (ref 150–400)
RBC: 4.9 MIL/uL (ref 4.22–5.81)
RDW: 11.9 % (ref 11.5–15.5)
WBC: 6.8 10*3/uL (ref 4.0–10.5)
nRBC: 0 % (ref 0.0–0.2)

## 2022-03-07 LAB — LIPASE, BLOOD: Lipase: 38 U/L (ref 11–51)

## 2022-03-07 LAB — TROPONIN I (HIGH SENSITIVITY): Troponin I (High Sensitivity): 8 ng/L (ref <18)

## 2022-03-07 MED ORDER — FENTANYL CITRATE PF 50 MCG/ML IJ SOSY
50.0000 ug | PREFILLED_SYRINGE | Freq: Once | INTRAMUSCULAR | Status: AC
  Administered 2022-03-07: 50 ug via INTRAVENOUS
  Filled 2022-03-07: qty 1

## 2022-03-07 MED ORDER — ONDANSETRON HCL 4 MG/2ML IJ SOLN
4.0000 mg | Freq: Once | INTRAMUSCULAR | Status: AC
  Administered 2022-03-07: 4 mg via INTRAVENOUS
  Filled 2022-03-07: qty 2

## 2022-03-07 MED ORDER — FAMOTIDINE IN NACL 20-0.9 MG/50ML-% IV SOLN
20.0000 mg | Freq: Once | INTRAVENOUS | Status: AC
  Administered 2022-03-07: 20 mg via INTRAVENOUS
  Filled 2022-03-07: qty 50

## 2022-03-07 MED ORDER — ACETAMINOPHEN 325 MG PO TABS
ORAL_TABLET | ORAL | Status: AC
  Filled 2022-03-07: qty 2

## 2022-03-07 MED ORDER — ALUM & MAG HYDROXIDE-SIMETH 200-200-20 MG/5ML PO SUSP
30.0000 mL | Freq: Once | ORAL | Status: AC
  Administered 2022-03-07: 30 mL via ORAL
  Filled 2022-03-07: qty 30

## 2022-03-07 MED ORDER — IOHEXOL 300 MG/ML  SOLN
100.0000 mL | Freq: Once | INTRAMUSCULAR | Status: AC | PRN
  Administered 2022-03-07: 100 mL via INTRAVENOUS

## 2022-03-07 MED ORDER — ONDANSETRON 4 MG PO TBDP: 4.0000 mg | ORAL_TABLET | Freq: Three times a day (TID) | ORAL | 0 refills | Status: DC | PRN

## 2022-03-07 MED ORDER — PANTOPRAZOLE SODIUM 40 MG PO TBEC: 40.0000 mg | DELAYED_RELEASE_TABLET | Freq: Every day | ORAL | 1 refills | Status: AC

## 2022-03-07 NOTE — ED Provider Notes (Signed)
7:29 AM Assumed care for off going team.   Blood pressure (!) 139/91, pulse (!) 54, temperature 97.8 F (36.6 C), temperature source Oral, resp. rate 16, height 5\' 10"  (1.778 m), weight 83.9 kg, SpO2 99 %.  See their HPI for full report but in brief pending UA   Patient is UA has some leukocytes in it but otherwise no WBCs or bacteria therefore we will hold off on treatment but will send for culture.  Assessment patient was resting comfortably in bed he reports some right lower quadrant abdominal pain but CT was without evidence of appendicitis.  No right upper quadrant pain to suggest cholecystitis.  He reports feeling comfortable with discharge home and we discussed discharge medications prescribed by initial doctor  Given patient got fentanyl I discussed with patient that he cannot drive and that they will need to help arrange him a ride home.  He expressed understanding    , MD 03/07/22 [phone]

## 2022-03-07 NOTE — ED Triage Notes (Signed)
Patient reports having abdominal pain for the past several hours.  Denies vomiting.

## 2022-03-07 NOTE — ED Notes (Signed)
Please call wife Caren Griffins with any update or if further info needed. Pt took the car and she has no way here.  [phone].

## 2022-03-07 NOTE — ED Notes (Signed)
Per wife, pt has Lewy Body Dementia and left this morning before she could get ready.  States that pt still drives. EDP Veronese notified.

## 2022-03-07 NOTE — Discharge Instructions (Addendum)

## 2022-03-07 NOTE — ED Notes (Signed)
This RN tried to collect urine from the patient after leaving the patient with urinal.  There was no urine in the urinal and pt stated that he urinated on a blanket that he had thrown in the floor. Urine cup given to pt at this time and pt stated that he would urinate in the cup.

## 2022-03-07 NOTE — ED Provider Notes (Signed)
[facility] Provider Note    Event Date/Time   First MD Initiated Contact with Patient 03/07/22 [phone]     (approximate)   History   Abdominal Pain   HPI  Greg Carter is a 61 y.o. male with a history of hypertension, hypothyroidism, Lewy body dementia, CAD who presents for evaluation of epigastric abdominal pain.  Patient reports intermittent dull epigastric abdominal pain for a week.  The pain is not worse postprandially.  He is not sure what makes the pain come on or off.  He has had some nausea and vomiting with it.  Has been moving his bowels without any difficulty.  He feels bloated.  He denies melena, hematemesis, hematochezia, coffee-ground emesis.  No chest pain or shortness of breath.  No prior abdominal surgeries.  Currently patient is complaining of 10 out of 10 pain.  Denies any alcohol use.     Past Medical History:  Diagnosis Date   Anginal pain (HCC)    Arthritis    Hypertension    Thyroid disease     Past Surgical History:  Procedure Laterality Date   both knees     surgery   CARDIAC CATHETERIZATION Left 05/04/2016   Procedure: Left Heart Cath and Coronary Angiography;  Surgeon: Lamar Blinks, MD;  Location: [facility] INVASIVE CV LAB;  Service: Cardiovascular;  Laterality: Left;   CARDIAC CATHETERIZATION N/A 05/04/2016   Procedure: Coronary Stent Intervention;  Surgeon: Marcina Millard, MD;  Location: [facility] INVASIVE CV LAB;  Service: Cardiovascular;  Laterality: N/A;   FL INJ RT SHOULDER CT ARTHROGRAM ([facility] HX)     TIBIA FRACTURE SURGERY       Physical Exam   Triage Vital Signs: ED Triage Vitals  Enc Vitals Group     BP 03/07/22 0213 (!) 106/91     Pulse Rate 03/07/22 0213 (!) 51     Resp 03/07/22 0213 18     Temp 03/07/22 0213 97.8 F (36.6 C)     Temp Source 03/07/22 0213 Oral     SpO2 03/07/22 0213 95 %     Weight 03/07/22 0209 185 lb (83.9 kg)     Height 03/07/22 0209 5\' 10"  (1.778 m)     Head Circumference --       Peak Flow --      Pain Score 03/07/22 0209 10     Pain Loc --      Pain Edu? --      Excl. in GC? --     Most recent vital signs: Vitals:   03/07/22 0525 03/07/22 0646  BP: 128/75 (!) 139/91  Pulse: (!) 54 (!) 54  Resp: 18 16  Temp:    SpO2: 95% 99%     Constitutional: Alert and oriented. Well appearing and in no apparent distress. HEENT:      Head: Normocephalic and atraumatic.         Eyes: Conjunctivae are normal. Sclera is non-icteric.       Mouth/Throat: Mucous membranes are moist.       Neck: Supple with no signs of meningismus. Cardiovascular: Regular rate and rhythm. No murmurs, gallops, or rubs. 2+ symmetrical distal pulses are present in all extremities.  Respiratory: Normal respiratory effort. Lungs are clear to auscultation bilaterally.  Gastrointestinal: Soft, tender to palpation epigastric region, and non distended with positive bowel sounds. No rebound or guarding. Genitourinary: No CVA tenderness. Musculoskeletal:  No edema, cyanosis, or erythema of extremities. Neurologic: Normal speech and language. Face  is symmetric. Moving all extremities. No gross focal neurologic deficits are appreciated. Skin: Skin is warm, dry and intact. No rash noted. Psychiatric: Mood and affect are normal. Speech and behavior are normal.  ED Results / Procedures / Treatments   Labs (all labs ordered are listed, but only abnormal results are displayed) Labs Reviewed  COMPREHENSIVE METABOLIC PANEL - Abnormal; Notable for the following components:      Result Value   Glucose, Bld 109 (*)    Total Bilirubin 1.3 (*)    All other components within normal limits  LIPASE, BLOOD  CBC  URINALYSIS, ROUTINE W REFLEX MICROSCOPIC  TROPONIN I (HIGH SENSITIVITY)     EKG  ED ECG REPORT I, Nita Sickle, the attending physician, personally viewed and interpreted this ECG.  Sinus rhythm with a rate of 52, normal intervals, normal axis, no ST elevations or  depressions.  RADIOLOGY I, Nita Sickle, attending MD, have personally viewed and interpreted the images obtained during this visit as below:  CT is negative   ___________________________________________________ Interpretation by Radiologist:  CT ABDOMEN PELVIS W CONTRAST  Result Date: 03/07/2022 CLINICAL DATA:  Epigastric pain. EXAM: CT ABDOMEN AND PELVIS WITH CONTRAST TECHNIQUE: Multidetector CT imaging of the abdomen and pelvis was performed using the standard protocol following bolus administration of intravenous contrast. RADIATION DOSE REDUCTION: This exam was performed according to the departmental dose-optimization program which includes automated exposure control, adjustment of the mA and/or kV according to patient size and/or use of iterative reconstruction technique. CONTRAST:  OMNIPAQUE IOHEXOL 300 MG/ML  SOLN COMPARISON:  100 cc Omnipaque 300 FINDINGS: Lower chest: No acute abnormality. Hepatobiliary: No focal liver abnormality is seen. No gallstones, gallbladder wall thickening, or biliary dilatation. Pancreas: Unremarkable. No pancreatic ductal dilatation or surrounding inflammatory changes. Spleen: Normal in size without focal abnormality. Adrenals/Urinary Tract: Normal adrenal glands. Nonobstructing stone within the upper pole of the left kidney measures 3 mm. No nephrolithiasis scratch no mass or hydronephrosis identified bilaterally. Urinary bladder appears normal. Stomach/Bowel: Stomach is within normal limits. Appendix appears normal. No evidence of bowel wall thickening, distention, or inflammatory changes. Vascular/Lymphatic: No significant vascular findings are present. No enlarged abdominal or pelvic lymph nodes. Reproductive: Prostate is unremarkable. Other: No free fluid or fluid collections. Musculoskeletal: Remote healed right lateral tenth rib fracture, image 42/2. No acute or suspicious osseous findings. IMPRESSION: 1. No acute findings within the abdomen or  pelvis. 2. Nonobstructing left renal calculus. Electronically Signed   By: Signa Kell M.D.   On: 03/07/2022 06:12      PROCEDURES:  Critical Care performed: No  Procedures    IMPRESSION / MDM / ASSESSMENT AND PLAN / ED COURSE  I reviewed the triage vital signs and the nursing notes.  61 y.o. male with a history of hypertension, hypothyroidism, Lewy body dementia, CAD who presents for evaluation of epigastric abdominal pain intermittent for 1 week associated with nausea and vomiting.  Patient is well-appearing in no distress with normal vital signs.  Abdomen is soft with epigastric tenderness, no rebound or guarding.  Ddx: Pancreatitis versus gallbladder pathology versus gastritis versus peptic ulcer disease versus GERD/indigestion versus ACS   Plan: CBC, CMP, lipase, EKG, troponin.  Patient placed on telemetry for monitoring of cardiorespiratory status.  We will give IV fentanyl, Zofran, and Pepcid.  Will give Maalox   MEDICATIONS GIVEN IN ED: Medications  fentaNYL (SUBLIMAZE) injection 50 mcg (50 mcg Intravenous Given 03/07/22 0520)  ondansetron (ZOFRAN) injection 4 mg (4 mg Intravenous Given 03/07/22 0519)  famotidine (PEPCID) IVPB 20 mg premix (0 mg Intravenous Stopped 03/07/22 0644)  alum & mag hydroxide-simeth (MAALOX/MYLANTA) 200-200-20 MG/5ML suspension 30 mL (30 mLs Oral Given 03/07/22 0518)  iohexol (OMNIPAQUE) 300 MG/ML solution 100 mL (100 mLs Intravenous Contrast Given 03/07/22 0532)     ED COURSE: EKG and troponin with no signs of ACS or demand ischemia.  Normal LFTs and lipase.  Normal electrolytes, no leukocytosis.  CT abdomen pelvis is unremarkable.  Presentation most likely concerning for indigestion/GERD.  Urinalysis is pending.  Care transferred to incoming MD at 7 AM.   Consults: None   EMR reviewed including records from his last admission to the hospital from 2 months ago for syncope    FINAL CLINICAL IMPRESSION(S) / ED DIAGNOSES   Final diagnoses:   Epigastric pain  Gastroesophageal reflux disease, unspecified whether esophagitis present     Rx / DC Orders   ED Discharge Orders          Ordered    pantoprazole (PROTONIX) 40 MG tablet  Daily        03/07/22 0651    ondansetron (ZOFRAN-ODT) 4 MG disintegrating tablet  Every 8 hours PRN        03/07/22 0651             Note:  This document was prepared using Dragon voice recognition software and may include unintentional dictation errors.   Please note:  Patient was evaluated in Emergency Department today for the symptoms described in the history of present illness. Patient was evaluated in the context of the global COVID-19 pandemic, which necessitated consideration that the patient might be at risk for infection with the SARS-CoV-2 virus that causes COVID-19. Institutional protocols and algorithms that pertain to the evaluation of patients at risk for COVID-19 are in a state of rapid change based on information released by regulatory bodies including the CDC and federal and state organizations. These policies and algorithms were followed during the patient's care in the ED.  Some ED evaluations and interventions may be delayed as a result of limited staffing during the pandemic.       Don PerkingVeronese, [place], MD 03/07/22 [phone]

## 2022-03-07 NOTE — ED Notes (Signed)
Pt states hx of abdominal pain and that he had a surgery when he was 'real young' and it stopped it.  Pt states pain has been on going on the past 2 weeks.  Pt is a poor historian.

## 2022-03-07 NOTE — ED Notes (Signed)
Pt's wife here to get patient. AVS given to wife, verbalized understanding.

## 2022-03-08 LAB — URINE CULTURE

## 2022-06-07 ENCOUNTER — Other Ambulatory Visit: Payer: Self-pay

## 2022-06-07 ENCOUNTER — Emergency Department: Payer: Medicaid Other

## 2022-06-07 DIAGNOSIS — R4182 Altered mental status, unspecified: Secondary | ICD-10-CM | POA: Insufficient documentation

## 2022-06-07 DIAGNOSIS — M545 Low back pain, unspecified: Secondary | ICD-10-CM | POA: Insufficient documentation

## 2022-06-07 DIAGNOSIS — Z5321 Procedure and treatment not carried out due to patient leaving prior to being seen by health care provider: Secondary | ICD-10-CM | POA: Diagnosis not present

## 2022-06-07 LAB — COMPREHENSIVE METABOLIC PANEL
ALT: 51 U/L — ABNORMAL HIGH (ref 0–44)
AST: 51 U/L — ABNORMAL HIGH (ref 15–41)
Albumin: 4.5 g/dL (ref 3.5–5.0)
Alkaline Phosphatase: 78 U/L (ref 38–126)
Anion gap: 9 (ref 5–15)
BUN: 21 mg/dL (ref 8–23)
CO2: 26 mmol/L (ref 22–32)
Calcium: 9.5 mg/dL (ref 8.9–10.3)
Chloride: 104 mmol/L (ref 98–111)
Creatinine, Ser: 1.48 mg/dL — ABNORMAL HIGH (ref 0.61–1.24)
GFR, Estimated: 53 mL/min — ABNORMAL LOW (ref >60)
Glucose, Bld: 103 mg/dL — ABNORMAL HIGH (ref 70–99)
Potassium: 4.1 mmol/L (ref 3.5–5.1)
Sodium: 139 mmol/L (ref 135–145)
Total Bilirubin: 1.4 mg/dL — ABNORMAL HIGH (ref 0.3–1.2)
Total Protein: 8.1 g/dL (ref 6.5–8.1)

## 2022-06-07 LAB — CBC
HCT: 42.5 % (ref 39.0–52.0)
Hemoglobin: 14.2 g/dL (ref 13.0–17.0)
MCH: 29.3 pg (ref 26.0–34.0)
MCHC: 33.4 g/dL (ref 30.0–36.0)
MCV: 87.6 fL (ref 80.0–100.0)
Platelets: 112 10*3/uL — ABNORMAL LOW (ref 150–400)
RBC: 4.85 MIL/uL (ref 4.22–5.81)
RDW: 12.5 % (ref 11.5–15.5)
WBC: 7.7 10*3/uL (ref 4.0–10.5)
nRBC: 0 % (ref 0.0–0.2)

## 2022-06-07 NOTE — ED Notes (Signed)
Pt leaving for CT.  

## 2022-06-07 NOTE — ED Notes (Addendum)
Pt somewhat slow to answer and HOH. Pt A&Ox4. Pt reports fell 3 times today either bc of tripping or legs becoming weak on him. Pt reports R sided head pain from fall today. No hematoma or swelling noted to R side of head currently. Pt reports cramping in legs today "from the sun".

## 2022-06-07 NOTE — ED Notes (Signed)
EKG to EDP Quale in person.  ?

## 2022-06-07 NOTE — ED Triage Notes (Signed)
Pt in from home via EMS today for lower back pain, dark and decreased urination; pt confirms normal BM's; pt confirms it burns/hurts some when he tries to urinate. Denies fever. Pt in NAD. Pt has cane; pt had sunglasses on but just took them off after this RN asked if he is having problems with his eyes; pt denies problem with eyes or hearing though pt constantly asks RN to repeat herself. Pt history of kidney stones.

## 2022-06-07 NOTE — ED Notes (Signed)
Blood collected by NT and sent to lab.

## 2022-06-07 NOTE — ED Notes (Addendum)
Messaged EDP McHugh via secure chat to have her review orders and this RN's notes and edit as needed. Pt given urine specimen cup and educated.

## 2022-06-08 ENCOUNTER — Emergency Department
Admission: EM | Admit: 2022-06-08 | Discharge: 2022-06-08 | Discharge status: Against Medical Advice | Payer: Medicaid Other | Attending: Emergency Medicine | Admitting: Emergency Medicine

## 2022-06-08 ENCOUNTER — Telehealth: Payer: Self-pay | Admitting: Emergency Medicine

## 2022-06-08 NOTE — Telephone Encounter (Signed)
Called patient due to left emergency department before provider exam to inquire about condition and follow up plans.  Patient number no answerl.   Called wife.  She agrees to have pcp look over th labs.  She says patient has been talking about coming back due to pain.  I told her that he can return any time.  I told her to try to prepare him for wait and to encourage him to wait to be seen.

## 2022-06-08 NOTE — ED Notes (Signed)
No answer when called several times from lobby 

## 2022-10-09 ENCOUNTER — Emergency Department: Payer: Medicaid Other

## 2022-10-09 ENCOUNTER — Other Ambulatory Visit: Payer: Self-pay

## 2022-10-09 ENCOUNTER — Emergency Department
Admission: EM | Admit: 2022-10-09 | Discharge: 2022-10-10 | Disposition: A | Discharge status: Home / Self Care | Payer: Medicaid Other | Attending: Emergency Medicine | Admitting: Emergency Medicine

## 2022-10-09 DIAGNOSIS — Z1152 Encounter for screening for COVID-19: Secondary | ICD-10-CM | POA: Diagnosis not present

## 2022-10-09 DIAGNOSIS — R4182 Altered mental status, unspecified: Secondary | ICD-10-CM

## 2022-10-09 DIAGNOSIS — U071 COVID-19: Secondary | ICD-10-CM | POA: Diagnosis not present

## 2022-10-09 DIAGNOSIS — E039 Hypothyroidism, unspecified: Secondary | ICD-10-CM | POA: Diagnosis not present

## 2022-10-09 LAB — URINALYSIS, COMPLETE (UACMP) WITH MICROSCOPIC
Bilirubin Urine: NEGATIVE
Glucose, UA: NEGATIVE mg/dL
Ketones, ur: NEGATIVE mg/dL
Leukocytes,Ua: NEGATIVE
Nitrite: NEGATIVE
Protein, ur: NEGATIVE mg/dL
Specific Gravity, Urine: 1.024 (ref 1.005–1.030)
pH: 6 (ref 5.0–8.0)

## 2022-10-09 LAB — CBC WITH DIFFERENTIAL/PLATELET
Abs Immature Granulocytes: 0.01 10*3/uL (ref 0.00–0.07)
Basophils Absolute: 0 10*3/uL (ref 0.0–0.1)
Basophils Relative: 0 %
Eosinophils Absolute: 0.1 10*3/uL (ref 0.0–0.5)
Eosinophils Relative: 1 %
HCT: 40.1 % (ref 39.0–52.0)
Hemoglobin: 13.7 g/dL (ref 13.0–17.0)
Immature Granulocytes: 0 %
Lymphocytes Relative: 12 %
Lymphs Abs: 0.6 10*3/uL — ABNORMAL LOW (ref 0.7–4.0)
MCH: 30.1 pg (ref 26.0–34.0)
MCHC: 34.2 g/dL (ref 30.0–36.0)
MCV: 88.1 fL (ref 80.0–100.0)
Monocytes Absolute: 0.5 10*3/uL (ref 0.1–1.0)
Monocytes Relative: 10 %
Neutro Abs: 4.3 10*3/uL (ref 1.7–7.7)
Neutrophils Relative %: 77 %
Platelets: 116 10*3/uL — ABNORMAL LOW (ref 150–400)
RBC: 4.55 MIL/uL (ref 4.22–5.81)
RDW: 12.4 % (ref 11.5–15.5)
WBC: 5.5 10*3/uL (ref 4.0–10.5)
nRBC: 0 % (ref 0.0–0.2)

## 2022-10-09 LAB — COMPREHENSIVE METABOLIC PANEL
ALT: 66 U/L — ABNORMAL HIGH (ref 0–44)
AST: 78 U/L — ABNORMAL HIGH (ref 15–41)
Albumin: 4.1 g/dL (ref 3.5–5.0)
Alkaline Phosphatase: 75 U/L (ref 38–126)
Anion gap: 10 (ref 5–15)
BUN: 19 mg/dL (ref 8–23)
CO2: 25 mmol/L (ref 22–32)
Calcium: 9 mg/dL (ref 8.9–10.3)
Chloride: 99 mmol/L (ref 98–111)
Creatinine, Ser: 1.24 mg/dL (ref 0.61–1.24)
GFR, Estimated: >60 mL/min (ref >60)
Glucose, Bld: 169 mg/dL — ABNORMAL HIGH (ref 70–99)
Potassium: 4.1 mmol/L (ref 3.5–5.1)
Sodium: 134 mmol/L — ABNORMAL LOW (ref 135–145)
Total Bilirubin: 1.4 mg/dL — ABNORMAL HIGH (ref 0.3–1.2)
Total Protein: 7.8 g/dL (ref 6.5–8.1)

## 2022-10-09 LAB — TROPONIN I (HIGH SENSITIVITY): Troponin I (High Sensitivity): 5 ng/L (ref <18)

## 2022-10-09 LAB — APTT: aPTT: 28 seconds (ref 24–36)

## 2022-10-09 LAB — PROTIME-INR
INR: 1.1 (ref 0.8–1.2)
Prothrombin Time: 14.1 seconds (ref 11.4–15.2)

## 2022-10-09 LAB — LACTIC ACID, PLASMA: Lactic Acid, Venous: 2 mmol/L (ref 0.5–1.9)

## 2022-10-09 MED ORDER — LACTATED RINGERS IV BOLUS
1000.0000 mL | Freq: Once | INTRAVENOUS | Status: AC
  Administered 2022-10-09: 1000 mL via INTRAVENOUS

## 2022-10-09 NOTE — ED Triage Notes (Signed)
Pt came via EMS. Family called EMS and PD because pt has been escalating all day being combative especially more aggressive since the sun went down, even locking himself in the bathroom. Pt has hx of alzheimer's, dementia, HTN and heart stents. Paramedic states last time he acted like this on a previous call he had a UTI. For EMS patient has not been combative but was hypertensive, temperature was 103, BP 159/92, HR 116, 93% on RA, RR 13-15.

## 2022-10-09 NOTE — ED Provider Notes (Signed)
[facility] Provider Note    Event Date/Time   First MD Initiated Contact with Patient 10/09/22 2309     (approximate)   History   Altered Mental Status   HPI  Greg Carter is a 62 y.o. male with hypothyroidism, Lewy body dementia, hyperlipidemia, ischemic chest pain who comes in from EMS due to altered mental status.  Patient's but more combative and aggressive since and went down walking himself in the bathroom.  With EMS patient has not been combative but there was concerns for tachycardia.  There was a concern that he could have a UTI given he is acted this way previously with prior UTI.  The wife is at bedside who reports that he has not been feeling well for the past 2 days and they did have a son who was positive for COVID today.  They report that he started to urinate on the trash can and put the dog food in a drawer.  He ambulates on his own.  Denies any falls.  He denies any pain anywhere except for his chronic "kidney pain".  Does have a history of kidney stones.   Physical Exam   Triage Vital Signs: ED Triage Vitals  Enc Vitals Group     BP 10/09/22 2255 (!) 139/94     Pulse Rate 10/09/22 2255 (!) 107     Resp 10/09/22 2255 15     Temp 10/09/22 2255 99.6 F (37.6 C)     Temp Source 10/09/22 2255 Oral     SpO2 10/09/22 2255 95 %     Weight --      Height --      Head Circumference --      Peak Flow --      Pain Score 10/09/22 2240 3     Pain Loc --      Pain Edu? --      Excl. in GC? --     Most recent vital signs: Vitals:   10/09/22 2255  BP: (!) 139/94  Pulse: (!) 107  Resp: 15  Temp: 99.6 F (37.6 C)  SpO2: 95%     General: Awake, no distress.  CV:  Good peripheral perfusion.  Resp:  Normal effort.  Abd:  No distention.  Other:  Cranial nerves are intact.  Equal strength in arms and legs.  Abdomen soft and nontender. No swelling in legs.  No calf tenderness.  ED Results / Procedures / Treatments   Labs (all  labs ordered are listed, but only abnormal results are displayed) Labs Reviewed  CULTURE, BLOOD (ROUTINE X 2)  CULTURE, BLOOD (ROUTINE X 2)  URINE CULTURE  RESP PANEL BY RT-PCR (RSV, FLU A&B, COVID)  [id number]  LACTIC ACID, PLASMA  LACTIC ACID, PLASMA  COMPREHENSIVE METABOLIC PANEL  CBC WITH DIFFERENTIAL/PLATELET  PROTIME-INR  APTT  URINALYSIS, COMPLETE (UACMP) WITH MICROSCOPIC  BRAIN NATRIURETIC PEPTIDE  AMMONIA  TROPONIN I (HIGH SENSITIVITY)     EKG  My interpretation of EKG:  Sinus tachycardia rate of 107 without any ST elevation or T wave inversions, normal intervals  RADIOLOGY I have reviewed the xray personally and interpreted and no pneumonia   PROCEDURES:  Critical Care performed: Yes, see critical care procedure note(s)  .Critical Care  Performed by: Concha Se, MD Authorized by: Concha Se, MD   Critical care provider statement:    Critical care time (minutes):  30   Critical care was necessary to treat or prevent imminent or  life-threatening deterioration of the following conditions:  Sepsis   Critical care was time spent personally by me on the following activities:  Development of treatment plan with patient or surrogate, discussions with consultants, evaluation of patient's response to treatment, examination of patient, ordering and review of laboratory studies, ordering and review of radiographic studies, ordering and performing treatments and interventions, pulse oximetry, re-evaluation of patient's condition and review of old charts .1-3 Lead EKG Interpretation  Performed by: Vanessa [place], MD Authorized by: Vanessa [place], MD     Interpretation: abnormal     ECG rate:  105   ECG rate assessment: tachycardic     Rhythm: sinus tachycardia     Ectopy: none     Conduction: normal      MEDICATIONS ORDERED IN ED: Medications  lactated ringers bolus 1,000 mL (has no administration in time range)     IMPRESSION / MDM / ASSESSMENT AND PLAN / ED  COURSE  I reviewed the triage vital signs and the nursing notes.   Patient's presentation is most consistent with acute presentation with potential threat to life or bodily function.   Patient comes with altered mental status low-grade temperatures, tachycardia.  Sepsis order set was used although at this time does not meet sepsis Foley and no obvious source will hold off on antibiotics.  Will test for COVID, flu, pneumonia, UTI.  Will get a CT head to make sure of this intercranial hemorrhage as well as CT renal given history of kidney stones and patient reports kidney pain.  At this time patient is able to be redirectable and wife is at bedside to help with him.  He has not been agitated here we will need to closely monitor for changes.     The patient is on the cardiac monitor to evaluate for evidence of arrhythmia and/or significant heart rate changes.      FINAL CLINICAL IMPRESSION(S) / ED DIAGNOSES   Final diagnoses:  None     Rx / DC Orders   ED Discharge Orders     None        Note:  This document was prepared using Dragon voice recognition software and may include unintentional dictation errors.

## 2022-10-09 NOTE — ED Notes (Signed)
Pt A&Ox4 when answering all questions. Pt IV placed and labs drawn. Hooked up to monitor and vital signs all stable.

## 2022-10-10 LAB — BRAIN NATRIURETIC PEPTIDE: B Natriuretic Peptide: 15.1 pg/mL (ref 0.0–100.0)

## 2022-10-10 LAB — RESP PANEL BY RT-PCR (RSV, FLU A&B, COVID)  RVPGX2
Influenza A by PCR: NEGATIVE
Influenza B by PCR: NEGATIVE
Resp Syncytial Virus by PCR: NEGATIVE
SARS Coronavirus 2 by RT PCR: POSITIVE — AB

## 2022-10-10 LAB — LACTIC ACID, PLASMA: Lactic Acid, Venous: 1 mmol/L (ref 0.5–1.9)

## 2022-10-10 LAB — LIPASE, BLOOD: Lipase: 29 U/L (ref 11–51)

## 2022-10-10 MED ORDER — ACETAMINOPHEN 500 MG PO TABS
1000.0000 mg | ORAL_TABLET | Freq: Once | ORAL | Status: AC
  Administered 2022-10-10: 1000 mg via ORAL
  Filled 2022-10-10: qty 2

## 2022-10-10 MED ORDER — ONDANSETRON 4 MG PO TBDP: 4.0000 mg | ORAL_TABLET | Freq: Three times a day (TID) | ORAL | 0 refills | Status: AC | PRN

## 2022-10-10 MED ORDER — ONDANSETRON HCL 4 MG/2ML IJ SOLN
4.0000 mg | Freq: Once | INTRAMUSCULAR | Status: AC
  Administered 2022-10-10: 4 mg via INTRAVENOUS
  Filled 2022-10-10: qty 2

## 2022-10-10 NOTE — Discharge Instructions (Addendum)
Patient was positive for COVID but there are no other signs of UTI or other acute infections.  We discussed antiviral medication but you have opted decline.  He can take ibuprofen 600 every 6-8 hours and Tylenol 650mg  every 8 hours.   His blood was noted to have lipids in it making it hard to get some results and his CT shows fatty pancreas but no signs of pancreatitis.  Please call your primary care doctor to follow-up next week to have lipid testing, triglycerides done.  He may be need to be started on medications to decrease these.  Return to the ER for worsening symptoms or any other concerns   IMPRESSION: 1. Punctate nonobstructing calculus in the upper pole of the left kidney. No evidence of hydronephrosis or ureteral calculus. 2. Marked fatty infiltration of the pancreas. 3. Bowel loops are normal in caliber. Normal appendix. No evidence of colitis or diverticulitis. 4. No CT evidence of acute abdominal/pelvic process.

## 2022-10-11 LAB — URINE CULTURE: Culture: NO GROWTH

## 2022-10-14 LAB — CULTURE, BLOOD (ROUTINE X 2)
Culture: NO GROWTH
Culture: NO GROWTH
Special Requests: ADEQUATE
Special Requests: ADEQUATE

## 2023-02-08 NOTE — Congregational Nurse Program (Signed)
  Dept: [phone]   Congregational Nurse Program Note  Date of Encounter: 02/08/2023 Client into nurse only clinic requesting help cleaning up bleeding wounds to right hand and right ankle. States he scraped open the sores made when he stumbled yesterday. 3 abrasions actively bleeding around a forming scab. HIPAA discussed. Declines screenings as he saw PCP last week and is seen at [facility] once a month to manage Lewy body dementia  (a form of progressive dementia that affects a person's ability to think, reason, and process information). Discussed safety and fall prevention. RTCj as needed to this clinic. Rhermann, RN Past Medical History: Past Medical History:  Diagnosis Date   Anginal pain (HCC)    Arthritis    Hypertension    Thyroid disease     Encounter Details:  CNP Questionnaire - 02/08/23 1330       Questionnaire   Ask client: Do you give verbal consent for me to treat you today? Yes    Student Assistance N/A    Location Patient Information systems manager, [employer]    Visit Setting with Client Organization    Patient Status Unknown    Insurance Medicaid    Insurance/Financial Assistance Referral N/A    Medication N/A    Medical Provider Yes    Screening Referrals Made N/A    Medical Referrals Made N/A    Medical Appointment Made N/A    Recently w/o PCP, now 1st time PCP visit completed due to CNs referral or appointment made N/A    Food Have Food Insecurities    Transportation N/A    Housing/Utilities N/A    Interpersonal Safety N/A    Interventions Advocate/Support    Abnormal to Normal Screening Since Last CN Visit N/A    Screenings CN Performed N/A    Sent Client to Lab for: N/A    Did client attend any of the following based off CNs referral or appointments made? N/A    ED Visit Averted N/A    Life-Saving Intervention Made N/A

## 2023-02-21 ENCOUNTER — Emergency Department: Payer: Medicaid Other

## 2023-02-21 ENCOUNTER — Encounter: Payer: Self-pay | Admitting: Emergency Medicine

## 2023-02-21 DIAGNOSIS — R0781 Pleurodynia: Secondary | ICD-10-CM | POA: Insufficient documentation

## 2023-02-21 DIAGNOSIS — R0902 Hypoxemia: Secondary | ICD-10-CM | POA: Insufficient documentation

## 2023-02-21 DIAGNOSIS — Z7901 Long term (current) use of anticoagulants: Secondary | ICD-10-CM | POA: Diagnosis not present

## 2023-02-21 DIAGNOSIS — Z5321 Procedure and treatment not carried out due to patient leaving prior to being seen by health care provider: Secondary | ICD-10-CM | POA: Diagnosis not present

## 2023-02-21 DIAGNOSIS — F0390 Unspecified dementia without behavioral disturbance: Secondary | ICD-10-CM | POA: Diagnosis not present

## 2023-02-21 DIAGNOSIS — Y92093 Driveway of other non-institutional residence as the place of occurrence of the external cause: Secondary | ICD-10-CM | POA: Insufficient documentation

## 2023-02-21 DIAGNOSIS — W010XXA Fall on same level from slipping, tripping and stumbling without subsequent striking against object, initial encounter: Secondary | ICD-10-CM | POA: Insufficient documentation

## 2023-02-21 DIAGNOSIS — R519 Headache, unspecified: Secondary | ICD-10-CM | POA: Diagnosis not present

## 2023-02-21 MED ORDER — OXYCODONE-ACETAMINOPHEN 5-325 MG PO TABS
1.0000 | ORAL_TABLET | Freq: Once | ORAL | Status: AC
  Administered 2023-02-21: 1 via ORAL
  Filled 2023-02-21: qty 1

## 2023-02-21 NOTE — ED Notes (Signed)
Per CT, the patient was unable to tolerate CT scan due to pain when lying flat.

## 2023-02-21 NOTE — ED Triage Notes (Signed)
Pt presents to triage via POV with complaints of L sided rib pain  & headache following a fall tonight in his driveway, pt on Eliquis. He was taking out the trash, slipped, and landed on the L side. Pt endorses significant pain when taking a deep breath - diminished breath sounds to the L side. Pt received 4mg  zofran and of fentanyl PTA via 20G IV. The pt is on 2L [place] with sat of 95%; on RA the patients sats were 86%. A&Ox4 at this time. Denies CP, dizziness, LOC, N/V.

## 2023-02-21 NOTE — ED Notes (Signed)
Contacted CT to attempt scan again at this time due to the patient receiving pain medication.

## 2023-02-22 ENCOUNTER — Other Ambulatory Visit: Payer: Self-pay

## 2023-02-22 ENCOUNTER — Emergency Department
Admission: EM | Admit: 2023-02-22 | Discharge: 2023-02-22 | Discharge status: Against Medical Advice | Payer: Medicaid Other | Attending: Emergency Medicine | Admitting: Emergency Medicine

## 2023-02-22 ENCOUNTER — Emergency Department: Payer: Medicaid Other

## 2023-02-22 ENCOUNTER — Emergency Department
Admission: EM | Admit: 2023-02-22 | Discharge: 2023-02-22 | Discharge status: Against Medical Advice | Payer: Medicaid Other | Source: Home / Self Care

## 2023-02-22 DIAGNOSIS — Z5321 Procedure and treatment not carried out due to patient leaving prior to being seen by health care provider: Secondary | ICD-10-CM | POA: Insufficient documentation

## 2023-02-22 DIAGNOSIS — F0390 Unspecified dementia without behavioral disturbance: Secondary | ICD-10-CM | POA: Insufficient documentation

## 2023-02-22 DIAGNOSIS — R0902 Hypoxemia: Secondary | ICD-10-CM | POA: Insufficient documentation

## 2023-02-22 DIAGNOSIS — R0781 Pleurodynia: Secondary | ICD-10-CM | POA: Insufficient documentation

## 2023-02-22 LAB — BASIC METABOLIC PANEL
Anion gap: 11 (ref 5–15)
BUN: 18 mg/dL (ref 8–23)
CO2: 24 mmol/L (ref 22–32)
Calcium: 9.2 mg/dL (ref 8.9–10.3)
Chloride: 98 mmol/L (ref 98–111)
Creatinine, Ser: 1.05 mg/dL (ref 0.61–1.24)
GFR, Estimated: >60 mL/min (ref >60)
Glucose, Bld: 125 mg/dL — ABNORMAL HIGH (ref 70–99)
Potassium: 4.8 mmol/L (ref 3.5–5.1)
Sodium: 133 mmol/L — ABNORMAL LOW (ref 135–145)

## 2023-02-22 LAB — CBC
HCT: 42.6 % (ref 39.0–52.0)
Hemoglobin: 14.3 g/dL (ref 13.0–17.0)
MCH: 30 pg (ref 26.0–34.0)
MCHC: 33.6 g/dL (ref 30.0–36.0)
MCV: 89.5 fL (ref 80.0–100.0)
Platelets: 128 10*3/uL — ABNORMAL LOW (ref 150–400)
RBC: 4.76 MIL/uL (ref 4.22–5.81)
RDW: 11.9 % (ref 11.5–15.5)
WBC: 7.8 10*3/uL (ref 4.0–10.5)
nRBC: 0 % (ref 0.0–0.2)

## 2023-02-22 NOTE — ED Notes (Addendum)
Pt told this EDT "I want to leave." RN informed. Pt made aware of the risks of leaving and informed to return to the ER or call 911 for any reason if he feels as if he is having a medical emergency. IV removed from pts right hand, wrapped with co-band. Pts family member pulled their vehicle up to the lobby entrance and pt walked out with Leanord Hawking, RN assistance.

## 2023-02-22 NOTE — ED Notes (Signed)
EDT was asked to help the family member of this pt get the pt to the bathroom to change his clothes. Family member and pt wanted to change the pts clothes due to urinary incontinence. EDT rolled the stretcher to the bathroom door to make the transfer from laying down to walking easier on the pt. Pt stated "I have broken bones. I know it. I can't move. Everything hurts." EDT offered to take pt into a private room and have this EDT and pts family member change his soiled clothes in bed, since pt stated he could not move without severe pain. Pt refused.  EDT and pts family member helped carefully sit pt up in bed and get his feet on the ground to stand. Pt then goes from standing to sitting back down multiple times. Pt appeared forgetful of instructions and required reminders of where we were and where we were going, which was to the bathroom to change his clothes per his request.   Pt and family member made it to the bathroom and both pt and family member denied wanting EDT in bathroom to aid pt. EDT informed pt and family member to pull the call bell string on the wall for any and all assistance. Pt and family member stated "Okay."  After the pt exited the bathroom, with the bed still positioned right outside the bathroom door, pt began wandering around asking "Is this my room? I'm going home since you guys wont give me anything for pain. Where m I going?" Pt redirected by EDT and family member to his bed, that was right in front of him. Pt proceeded to refuse to sit down and stated "I am going home." EDT asked pt to please sit down on the bed because there are many steps that need to be performed before the pt can leave AMA, such as take the IV out of his hand. Pt refused but sat down on the bed. Pt then refused to lift his legs in the bed.   At this point, pt began repeating himself stating "Is that my room? I need to go to the bathroom." EDT and family member asked pt multiple times to lift his legs in the  bed, or have this EDT assist him, for his safety since pt appeared to be unsteady on his feet, according to pts family member.   Pt responded with "Do not touch me. I can't get my shoes off to fight. Lay down the bed." EDT reiterated that EDT is here to help pt and in order to do so, his legs needed to be in the bed so the side rail could be raised for his safety. Pt allowed EDT to raise his legs into the bed.   Pt still refusing CT scan or "any other procedures" at this time. Pts family member wants pt to go to CT scan.

## 2023-02-22 NOTE — ED Triage Notes (Signed)
Pt to ED from [facility] health services for hypoxia. Pt wearing 2L [place] on arrival.  Currently 93% on RA.  Pt disoriented, hx of dementia. Has to be asked questions multiple times.  Pt endorses pain to ribs with movement.  LWBS last night

## 2023-02-22 NOTE — ED Triage Notes (Signed)
First Nurse Note: Pt via EMS from [facility]. Pt was here for a fall yesterday and left AMA, pt went to his PCP today and double his suboxone dose this morning. Pt sat mid 80s. Fire placed pt on 2L [place] and increased to 94%. Denies SOB. Pt has a hx of dementia but at his baseline.  120/86 BP 70s HR
